# Patient Record
Sex: Female | Born: 1975 | Race: White | Hispanic: No | Marital: Married | Smoking: Never smoker
Health system: Southern US, Community
[De-identification: ages and names within clinical notes are randomized; demographics above are authoritative.]

## PROBLEM LIST (undated history)

## (undated) ENCOUNTER — Inpatient Hospital Stay (HOSPITAL_COMMUNITY): Payer: Self-pay

## (undated) ENCOUNTER — Emergency Department (HOSPITAL_BASED_OUTPATIENT_CLINIC_OR_DEPARTMENT_OTHER): Admission: EM | Payer: Self-pay | Source: Home / Self Care

## (undated) DIAGNOSIS — T7840XA Allergy, unspecified, initial encounter: Secondary | ICD-10-CM

## (undated) DIAGNOSIS — F329 Major depressive disorder, single episode, unspecified: Secondary | ICD-10-CM

## (undated) DIAGNOSIS — Z789 Other specified health status: Secondary | ICD-10-CM

## (undated) DIAGNOSIS — F32A Depression, unspecified: Secondary | ICD-10-CM

## (undated) DIAGNOSIS — F419 Anxiety disorder, unspecified: Secondary | ICD-10-CM

## (undated) HISTORY — PX: LASIK: SHX215

## (undated) HISTORY — DX: Major depressive disorder, single episode, unspecified: F32.9

## (undated) HISTORY — DX: Depression, unspecified: F32.A

## (undated) HISTORY — DX: Anxiety disorder, unspecified: F41.9

## (undated) HISTORY — DX: Allergy, unspecified, initial encounter: T78.40XA

## (undated) HISTORY — PX: MOLE REMOVAL: SHX2046

---

## 2012-04-21 NOTE — L&D Delivery Note (Signed)
Delivery Note At 2:27 AM a healthy female was delivered via Vaginal, Spontaneous Delivery (Presentation: Vtx  ).  APGAR: 9,9 ; weight pending .   Placenta status: Intact, Spontaneous.  Cord:  with the following complications: .  Cord pH: not done  Anesthesia: Epidural  Episiotomy: None Lacerations: 2nd degree perineal Suture Repair: vicryl rapide Est. Blood Loss (mL): 300 Mom to postpartum.  Baby to nursery-stable.  Yanina Knupp,MARIE-LYNE 12/19/2012, 3:01 AM

## 2012-05-08 ENCOUNTER — Encounter (HOSPITAL_COMMUNITY): Payer: Self-pay

## 2012-05-08 ENCOUNTER — Inpatient Hospital Stay (HOSPITAL_COMMUNITY): Payer: BC Managed Care – PPO

## 2012-05-08 ENCOUNTER — Inpatient Hospital Stay (HOSPITAL_COMMUNITY)
Admission: AD | Admit: 2012-05-08 | Discharge: 2012-05-08 | Disposition: A | Discharge status: Home / Self Care | Payer: BC Managed Care – PPO | Source: Ambulatory Visit | Attending: Obstetrics and Gynecology | Admitting: Obstetrics and Gynecology

## 2012-05-08 DIAGNOSIS — O200 Threatened abortion: Secondary | ICD-10-CM | POA: Diagnosis present

## 2012-05-08 NOTE — MAU Note (Signed)
I had spotting Thurs and had blood work at office. Told me my hormone levels were very good and high and they were not concerned. Today about 1700 had more bright blood on tissue when wiped and one small clot. Slight aching right side but not a lot of pain

## 2012-05-08 NOTE — MAU Provider Note (Signed)
  History   CSN: [id number]  Arrival date and time: 05/08/12 2045 Call to provider @ 2118 Provider in to examine patient @ 2145  Chief Complaint  Patient presents with  . Vaginal Bleeding   HPI Spotting last week - quant done at [facility] (>20.000) ABO/ RH - B positive No pain or cramping / some aching on right side since + UPT Seen in office by Dr Ernestina Penna for diagnosis of pregnancy / records pending from [facility] scheduled for ultrasound on 1/22 @ [facility]  No past medical history on file. G2 P 1 with previous term SVD in [facility] / first trimester bleeding from [facility] Sure LMP - EDC by LMP 12-29-2012  No past surgical history on file.  No family history on file.  History  Substance Use Topics  . Smoking status: Never Smoker   . Smokeless tobacco: Not on file  . Alcohol Use: No   Allergies: No Known Allergies  No prescriptions prior to admission   ROS Physical Exam   Blood pressure 140/84, pulse 90, temperature 98.7 F (37.1 C), temperature source Oral, resp. rate 16, height 5\' 7"  (1.702 m), weight 69.4 kg (153 lb), last menstrual period 03/24/2012.  Physical Exam Alert and oriented NAD or discomfort  Abdomen soft and non-tender  External genitalia wnl / no lesions or rashes / small dark red blood on peripad  Spec exam :Vaginal vault with small amount dark red blood Cervix appears closed / no clots or tissue visualized  Bimanual exam:uterus retroverted / no appreciable enlargement / no tenderness Adnexa without enlargement or tenderness  MAU Course  Procedures Transvaginal ultrasound Clinical Data: Bleeding and right pelvic pain. Estimated  gestational age by LMP is 6 weeks 3 days.  OBSTETRIC <14 WK [place] AND TRANSVAGINAL OB US  Technique: Both transabdominal and transvaginal ultrasound  examinations were performed for complete evaluation of the  gestation as well as the maternal uterus, adnexal regions, and  pelvic cul-de-sac. Transvaginal technique was  performed to assess  early pregnancy.  Comparison: None.  Intrauterine gestational sac: A single intrauterine pregnancy is  visualized. There is heterogeneous fluid collection to the left of  the gestational sac and extending inferiorly consistent with a  large subchorionic hemorrhage, measuring about 4.3 x 2.2 x 2.2 cm.   Yolk sac: The yolk sac is visualized.  Embryo: The fetal pole is visualized.  Cardiac Activity: Fetal cardiac activity is observed.  Heart Rate: 122 bpm  CRL: 7.1 mm 6 w 5 d [place] EDC: 12/27/2012   Maternal uterus/adnexae:  The right ovary measures 2.6 x 3.3 x 2.3 cm and contains a corpus  luteal cyst. The left ovary measures 1.4 x 2.8 x 1.3 cm with  normal follicular changes. Small amount of free fluid around the  left ovary.     Assessment and Plan  6 weeks by LMP Threatened Abortion - bleeding first trimester from [facility]  1) discharge home 2) pelvic rest and miscarriage precautions to call 3) keep appointment at [facility] 1/22 for repeat ultrasound  Answered questions about breastfeeding - ok to continue weaning / BF did NOT cause bleeding.  Will not contribute to increase risk for miscarriage. No prevention or treatment for [facility] -only supportive care with decreased activity until bleeding stops.  Marlinda Mike 05/08/2012, 9:58 PM

## 2012-05-08 NOTE — MAU Note (Signed)
Notified Marlinda Mike CNM patient G2P1 [redacted]w[redacted]d had spotting last week, BHCG was checked in MD office and was told high enough no cause for concern, bleeding today heavier, denies pain, CNM will come to MAU to evaluate patient.

## 2012-05-19 LAB — OB RESULTS CONSOLE RPR: RPR: NONREACTIVE

## 2012-05-19 LAB — OB RESULTS CONSOLE ABO/RH: RH Type: POSITIVE

## 2012-05-19 LAB — OB RESULTS CONSOLE HIV ANTIBODY (ROUTINE TESTING): HIV: NONREACTIVE

## 2012-05-19 LAB — OB RESULTS CONSOLE HEPATITIS B SURFACE ANTIGEN: Hepatitis B Surface Ag: NEGATIVE

## 2012-12-18 ENCOUNTER — Inpatient Hospital Stay (HOSPITAL_COMMUNITY)
Admission: AD | Admit: 2012-12-18 | Discharge: 2012-12-20 | DRG: 373 | Disposition: A | Discharge status: Home / Self Care | Payer: BC Managed Care – PPO | Source: Ambulatory Visit | Attending: Obstetrics & Gynecology | Admitting: Obstetrics & Gynecology

## 2012-12-18 ENCOUNTER — Encounter (HOSPITAL_COMMUNITY): Payer: Self-pay

## 2012-12-18 ENCOUNTER — Inpatient Hospital Stay (HOSPITAL_COMMUNITY): Payer: BC Managed Care – PPO | Admitting: Anesthesiology

## 2012-12-18 ENCOUNTER — Encounter (HOSPITAL_COMMUNITY): Payer: Self-pay | Admitting: Anesthesiology

## 2012-12-18 DIAGNOSIS — O09529 Supervision of elderly multigravida, unspecified trimester: Secondary | ICD-10-CM | POA: Diagnosis present

## 2012-12-18 DIAGNOSIS — Z22330 Carrier of Group B streptococcus: Secondary | ICD-10-CM

## 2012-12-18 DIAGNOSIS — O200 Threatened abortion: Secondary | ICD-10-CM

## 2012-12-18 DIAGNOSIS — O99892 Other specified diseases and conditions complicating childbirth: Secondary | ICD-10-CM | POA: Diagnosis present

## 2012-12-18 HISTORY — DX: Other specified health status: Z78.9

## 2012-12-18 LAB — CBC
Hemoglobin: 12.7 g/dL (ref 12.0–15.0)
MCH: 28.7 pg (ref 26.0–34.0)
MCHC: 34.3 g/dL (ref 30.0–36.0)
MCV: 83.7 fL (ref 78.0–100.0)
Platelets: 280 10*3/uL (ref 150–400)
RBC: 4.42 MIL/uL (ref 3.87–5.11)

## 2012-12-18 MED ORDER — PENICILLIN G POTASSIUM 5000000 UNITS IJ SOLR
5.0000 10*6.[IU] | Freq: Once | INTRAMUSCULAR | Status: AC
  Administered 2012-12-18: 5 10*6.[IU] via INTRAVENOUS
  Filled 2012-12-18: qty 5

## 2012-12-18 MED ORDER — EPHEDRINE 5 MG/ML INJ
10.0000 mg | INTRAVENOUS | Status: DC | PRN
  Filled 2012-12-18: qty 4
  Filled 2012-12-18: qty 2

## 2012-12-18 MED ORDER — CITRIC ACID-SODIUM CITRATE 334-500 MG/5ML PO SOLN: 30.0000 mL | ORAL | Status: DC | PRN

## 2012-12-18 MED ORDER — OXYTOCIN BOLUS FROM INFUSION: 500.0000 mL | INTRAVENOUS | Status: DC

## 2012-12-18 MED ORDER — PHENYLEPHRINE 40 MCG/ML (10ML) SYRINGE FOR IV PUSH (FOR BLOOD PRESSURE SUPPORT)
80.0000 ug | PREFILLED_SYRINGE | INTRAVENOUS | Status: DC | PRN
  Filled 2012-12-18: qty 5
  Filled 2012-12-18: qty 2

## 2012-12-18 MED ORDER — LACTATED RINGERS IV SOLN
500.0000 mL | Freq: Once | INTRAVENOUS | Status: AC
  Administered 2012-12-18: 500 mL via INTRAVENOUS

## 2012-12-18 MED ORDER — OXYTOCIN 40 UNITS IN LACTATED RINGERS INFUSION - SIMPLE MED
62.5000 mL/h | INTRAVENOUS | Status: DC
  Administered 2012-12-19: 62.5 mL/h via INTRAVENOUS
  Filled 2012-12-18: qty 1000

## 2012-12-18 MED ORDER — IBUPROFEN 600 MG PO TABS
600.0000 mg | ORAL_TABLET | Freq: Four times a day (QID) | ORAL | Status: DC | PRN
  Administered 2012-12-19: 600 mg via ORAL
  Filled 2012-12-18: qty 1

## 2012-12-18 MED ORDER — FENTANYL 2.5 MCG/ML BUPIVACAINE 1/10 % EPIDURAL INFUSION (WH - ANES)
14.0000 mL/h | INTRAMUSCULAR | Status: DC | PRN
  Administered 2012-12-18: 14 mL/h via EPIDURAL
  Filled 2012-12-18: qty 125

## 2012-12-18 MED ORDER — ONDANSETRON HCL 4 MG/2ML IJ SOLN: 4.0000 mg | Freq: Four times a day (QID) | INTRAMUSCULAR | Status: DC | PRN

## 2012-12-18 MED ORDER — LIDOCAINE HCL (PF) 1 % IJ SOLN
30.0000 mL | INTRAMUSCULAR | Status: DC | PRN
  Administered 2012-12-19: 30 mL via SUBCUTANEOUS
  Filled 2012-12-18 (×2): qty 30

## 2012-12-18 MED ORDER — PHENYLEPHRINE 40 MCG/ML (10ML) SYRINGE FOR IV PUSH (FOR BLOOD PRESSURE SUPPORT)
80.0000 ug | PREFILLED_SYRINGE | INTRAVENOUS | Status: DC | PRN
  Filled 2012-12-18: qty 2

## 2012-12-18 MED ORDER — ACETAMINOPHEN 325 MG PO TABS: 650.0000 mg | ORAL_TABLET | ORAL | Status: DC | PRN

## 2012-12-18 MED ORDER — LACTATED RINGERS IV SOLN: INTRAVENOUS | Status: DC

## 2012-12-18 MED ORDER — DIPHENHYDRAMINE HCL 50 MG/ML IJ SOLN: 12.5000 mg | INTRAMUSCULAR | Status: DC | PRN

## 2012-12-18 MED ORDER — PENICILLIN G POTASSIUM 5000000 UNITS IJ SOLR
2.5000 10*6.[IU] | INTRAMUSCULAR | Status: DC
  Filled 2012-12-18 (×4): qty 2.5

## 2012-12-18 MED ORDER — OXYCODONE-ACETAMINOPHEN 5-325 MG PO TABS: 1.0000 | ORAL_TABLET | ORAL | Status: DC | PRN

## 2012-12-18 MED ORDER — LACTATED RINGERS IV SOLN: 500.0000 mL | INTRAVENOUS | Status: DC | PRN

## 2012-12-18 MED ORDER — EPHEDRINE 5 MG/ML INJ
10.0000 mg | INTRAVENOUS | Status: DC | PRN
  Filled 2012-12-18: qty 2

## 2012-12-18 MED ORDER — FLEET ENEMA 7-19 GM/118ML RE ENEM: 1.0000 | ENEMA | RECTAL | Status: DC | PRN

## 2012-12-18 MED ORDER — LIDOCAINE HCL (PF) 1 % IJ SOLN
INTRAMUSCULAR | Status: DC | PRN
  Administered 2012-12-18 (×2): 5 mL

## 2012-12-18 NOTE — Anesthesia Preprocedure Evaluation (Signed)

## 2012-12-18 NOTE — Plan of Care (Signed)
Problem: Consults Goal: Birthing Suites Patient Information Press F2 to bring up selections list Outcome: Completed/Met Date Met:  12/18/12  Pt 37-[redacted] weeks EGA

## 2012-12-18 NOTE — Anesthesia Procedure Notes (Signed)
Epidural Patient location during procedure: OB Start time: 12/18/2012 11:15 PM  Staffing Anesthesiologist: Angus Seller., Harrell Gave. Performed by: anesthesiologist   Preanesthetic Checklist Completed: patient identified, site marked, surgical consent, pre-op evaluation, timeout performed, IV checked, risks and benefits discussed and monitors and equipment checked  Epidural Patient position: sitting Prep: site prepped and draped and DuraPrep Patient monitoring: continuous pulse ox and blood pressure Approach: midline Injection technique: LOR air and LOR saline  Needle:  Needle type: Tuohy  Needle gauge: 17 G Needle length: 9 cm and 9 Needle insertion depth: 5 cm cm Catheter type: closed end flexible Catheter size: 19 Gauge Catheter at skin depth: 10 cm Test dose: negative  Assessment Events: blood not aspirated, injection not painful, no injection resistance, negative IV test and no paresthesia  Additional Notes Patient identified.  Risk benefits discussed including failed block, incomplete pain control, headache, nerve damage, paralysis, blood pressure changes, nausea, vomiting, reactions to medication both toxic or allergic, and postpartum back pain.  Patient expressed understanding and wished to proceed.  All questions were answered.  Sterile technique used throughout procedure and epidural site dressed with sterile barrier dressing. No paresthesia or other complications noted.The patient did not experience any signs of intravascular injection such as tinnitus or metallic taste in mouth nor signs of intrathecal spread such as rapid motor block. Please see nursing notes for vital signs.

## 2012-12-19 ENCOUNTER — Encounter (HOSPITAL_COMMUNITY): Payer: Self-pay | Admitting: *Deleted

## 2012-12-19 LAB — CBC
MCHC: 33.6 g/dL (ref 30.0–36.0)
Platelets: 251 10*3/uL (ref 150–400)
RDW: 13.7 % (ref 11.5–15.5)

## 2012-12-19 MED ORDER — TETANUS-DIPHTH-ACELL PERTUSSIS 5-2.5-18.5 LF-MCG/0.5 IM SUSP: 0.5000 mL | Freq: Once | INTRAMUSCULAR | Status: DC

## 2012-12-19 MED ORDER — DIBUCAINE 1 % RE OINT: 1.0000 "application " | TOPICAL_OINTMENT | RECTAL | Status: DC | PRN

## 2012-12-19 MED ORDER — ONDANSETRON HCL 4 MG/2ML IJ SOLN: 4.0000 mg | INTRAMUSCULAR | Status: DC | PRN

## 2012-12-19 MED ORDER — BENZOCAINE-MENTHOL 20-0.5 % EX AERO
1.0000 "application " | INHALATION_SPRAY | CUTANEOUS | Status: DC | PRN
  Administered 2012-12-19: 1 via TOPICAL
  Filled 2012-12-19: qty 56

## 2012-12-19 MED ORDER — PRENATAL MULTIVITAMIN CH
1.0000 | ORAL_TABLET | Freq: Every day | ORAL | Status: DC
  Filled 2012-12-19: qty 1

## 2012-12-19 MED ORDER — SENNOSIDES-DOCUSATE SODIUM 8.6-50 MG PO TABS: 2.0000 | ORAL_TABLET | Freq: Every day | ORAL | Status: DC

## 2012-12-19 MED ORDER — OXYTOCIN 40 UNITS IN LACTATED RINGERS INFUSION - SIMPLE MED: 62.5000 mL/h | INTRAVENOUS | Status: DC | PRN

## 2012-12-19 MED ORDER — OXYCODONE-ACETAMINOPHEN 5-325 MG PO TABS
1.0000 | ORAL_TABLET | ORAL | Status: DC | PRN
  Administered 2012-12-19 – 2012-12-20 (×4): 1 via ORAL
  Filled 2012-12-19 (×4): qty 1

## 2012-12-19 MED ORDER — IBUPROFEN 600 MG PO TABS
600.0000 mg | ORAL_TABLET | Freq: Four times a day (QID) | ORAL | Status: DC
  Administered 2012-12-19 – 2012-12-20 (×5): 600 mg via ORAL
  Filled 2012-12-19 (×5): qty 1

## 2012-12-19 MED ORDER — ZOLPIDEM TARTRATE 5 MG PO TABS: 5.0000 mg | ORAL_TABLET | Freq: Every evening | ORAL | Status: DC | PRN

## 2012-12-19 MED ORDER — DIPHENHYDRAMINE HCL 25 MG PO CAPS: 25.0000 mg | ORAL_CAPSULE | Freq: Four times a day (QID) | ORAL | Status: DC | PRN

## 2012-12-19 MED ORDER — WITCH HAZEL-GLYCERIN EX PADS: 1.0000 "application " | MEDICATED_PAD | CUTANEOUS | Status: DC | PRN

## 2012-12-19 MED ORDER — SIMETHICONE 80 MG PO CHEW: 80.0000 mg | CHEWABLE_TABLET | ORAL | Status: DC | PRN

## 2012-12-19 MED ORDER — ONDANSETRON HCL 4 MG PO TABS: 4.0000 mg | ORAL_TABLET | ORAL | Status: DC | PRN

## 2012-12-19 MED ORDER — LANOLIN HYDROUS EX OINT: TOPICAL_OINTMENT | CUTANEOUS | Status: DC | PRN

## 2012-12-19 NOTE — Lactation Note (Signed)
This note was copied from the chart of Beth Anjani Feuerborn. Lactation Consultation Note     Initial consult with this mom and term baby, now 9 hours post partum. Mom was breast feeding in cradle hold when I walked into the room. I reviewed with mom the recommended cross-cradle or football for the first 2-3 weeks, to obtain a deeper latch, and assisted mom with doing this. The baby was not interested in feeding at this time, so skin to skin was advised. Cue based and luster feeding reviewed, as well as lactation services. Mom knows to call for questions/concerns.  Patient Name: Beth Reid [id number] Date: 12/19/2012 Reason for consult: Initial assessment   Maternal Data Formula Feeding for Exclusion: No Has patient been taught Hand Expression?: Yes Does the patient have breastfeeding experience prior to this delivery?: Yes  Feeding Feeding Type: Breast Milk  LATCH Score/Interventions Latch: Repeated attempts needed to sustain latch, nipple held in mouth throughout feeding, stimulation needed to elicit sucking reflex. Intervention(s): Adjust position;Assist with latch  Audible Swallowing: None Intervention(s): Skin to skin;Hand expression (drpos expressed into baby's mouth) Intervention(s): Hand expression  Type of Nipple: Everted at rest and after stimulation  Comfort (Breast/Nipple): Soft / non-tender (nipple stripes noted. frenulum at tip of tongue)     Hold (Positioning): Assistance needed to correctly position infant at breast and maintain latch. Intervention(s): Breastfeeding basics reviewed;Support Pillows;Position options;Skin to skin (cross cradle hold advised, cue based and cluster feeding)  LATCH Score: 6  Lactation Tools Discussed/Used     Consult Status Consult Status: Follow-up Date: 12/20/12 Follow-up type: In-patient    Alfred Levins 12/19/2012, 12:17 PM

## 2012-12-19 NOTE — Clinical Social Work Note (Signed)
CSW spoke with MOB.  MOB reports hx of PPD with first infant, however she feels this was situational due to having a major infection after she returned home from delivering first baby, and needing medical support.  No current concerns and MOB expressed knowing what symptoms to look out for.   Patient was referred for history of depression/anxiety.  * Referral screened out by Clinical Social Worker because none of the following criteria appear to apply: ~ History of anxiety/depression during this pregnancy, or of post-partum depression. ~ Diagnosis of anxiety and/or depression within last 3 years ~ History of depression due to pregnancy loss/loss of child  OR  * Patient's symptoms currently being treated with medication and/or therapy.  Please contact the Clinical Social Worker if needs arise, or by the patient's request.  

## 2012-12-19 NOTE — H&P (Signed)
Subjective:  Beth Reid is a 37 y.o. G2 P1 female with EDC 12/29/12 at 30 and 4/[redacted] weeks gestation who is being admitted for labor management.  Her current obstetrical history is significant for [facility] resolved at 18 wk [place].  Patient reports no complaints.   Fetal Movement: normal.     Objective:   Vital signs in last 24 hours: Temp:  [97 F (36.1 C)-98.2 F (36.8 C)] 97.9 F (36.6 C) (08/31 0030) Pulse Rate:  [30-109] 102 (08/31 0100) Resp:  [16-18] 18 (08/31 0030) BP: (114-141)/(61-97) 120/61 mmHg (08/31 0100) SpO2:  [80 %-100 %] 100 % (08/30 2340) Weight:  [80.377 kg (177 lb 3.2 oz)] 80.377 kg (177 lb 3.2 oz) (08/30 2144)   General:   alert  Skin:   normal  HEENT:  PERRLA  Lungs:   clear to auscultation bilaterally  Heart:   regular rate and rhythm  Breasts:   Deferred  Abdomen:  Gravid  Pelvis:  Exam deferred.  FHT:  140's BPM, good variability, no deceleration.  Uterine Size: size equals dates  Presentations: cephalic  Cervix:    Dilation: 8   Effacement: 100%   Station:  -1   Consistency: soft   Position: middle                              UCs q3 min +++ Lab Review  B, Rh+, GBS positive  AFP:NML  One hour GTT: Normal    Assessment/Plan:  38 and 4/[redacted] weeks gestation. Active phase labor.  F-Well being reassuring.  GBS pos.  Pen G. Obstetrical history significant for resolved SCH at 18 wk [place] .     Risks, benefits, alternatives and possible complications have been discussed in detail with the patient.  Pre-admission, admission, and post admission procedures and expectations were discussed in detail.  All questions answered, all appropriate consents will be signed at the Hospital. Admission is planned for today.  Expectant management.  Epidural PRN.  Genia Del MD  12/19/2012

## 2012-12-19 NOTE — Anesthesia Postprocedure Evaluation (Signed)
  Anesthesia Post-op Note  Patient: Beth Reid  Procedure(s) Performed: * No procedures listed *  Patient Location: PACU and Mother/Baby  Anesthesia Type:Epidural  Level of Consciousness: awake, alert  and oriented  Airway and Oxygen Therapy: Patient Spontanous Breathing  Post-op Pain: none  Post-op Assessment: Post-op Vital signs reviewed, Patient's Cardiovascular Status Stable, No headache, No backache, No residual numbness and No residual motor weakness  Post-op Vital Signs: Reviewed and stable  Complications: No apparent anesthesia complications

## 2012-12-20 MED ORDER — OXYCODONE-ACETAMINOPHEN 5-325 MG PO TABS: 1.0000 | ORAL_TABLET | ORAL | Status: DC | PRN

## 2012-12-20 MED ORDER — IBUPROFEN 600 MG PO TABS: 600.0000 mg | ORAL_TABLET | Freq: Four times a day (QID) | ORAL | Status: DC

## 2012-12-20 NOTE — Progress Notes (Signed)
PPD #1- SVD  Subjective:   Reports feeling good Tolerating po/ No nausea or vomiting Bleeding is light Pain controlled with Motrin and Percocet Up ad lib / ambulatory / voiding without problems Newborn: breastfeeding  / Circumcision: no   Objective:   VS: Temp:  [97.5 F (36.4 C)] 97.5 F (36.4 C) (09/01 0528) Pulse Rate:  [66-69] 69 (09/01 0528) Resp:  [18] 18 (09/01 0528) BP: (95-110)/(58-70) 95/58 mmHg (09/01 0528)  LABS:  Recent Labs  12/18/12 2156 12/19/12 0700  WBC 17.1* 23.4*  HGB 12.7 11.1*  PLT 280 251   Blood type: B/Positive/-- (01/29 0000) Rubella: Immune (01/29 0000)                I&O: Intake/Output     08/31 0701 - 09/01 0700 09/01 0701 - 09/02 0700   Blood     Total Output       Net              Physical Exam: Alert and oriented X3 Lungs: Clear and unlabored Heart: regular rate and rhythm / no mumurs Abdomen: soft, non-tender, non-distended  Fundus: firm, non-tender, U-1 Perineum: Well approximated, no significant erythema, edema, or drainage; healing well. Lochia: small Extremities: no edema, no calf pain or tenderness    Assessment: PPD # 2 G2P2002/ S/P:spontaneous vaginal, 2nd degree laceration Doing well - stable for discharge home   Plan: Discharge home RX's:  Ibuprofen 600mg  po Q 6 hrs prn pain #30 Refill x 0 Percocet 5/325 1 to 2 po Q 4 hrs prn pain #30 No refill Routine pp visit in 6wks [facility] booklet given    Beth Reid, N MSN, CNM 12/20/2012, 11:12 AM

## 2012-12-20 NOTE — Discharge Summary (Signed)
Obstetric Discharge Summary Reason for Admission: onset of labor Prenatal Procedures: none Intrapartum Procedures: spontaneous vaginal delivery and GBS prophylaxis Postpartum Procedures: none Complications-Operative and Postpartum: 2nd degree perineal laceration Hemoglobin  Date Value Range Status  12/19/2012 11.1* 12.0 - 15.0 g/dL Final     HCT  Date Value Range Status  12/19/2012 33.0* 36.0 - 46.0 % Final    Physical Exam:  General: alert and cooperative Lochia: appropriate Uterine Fundus: firm Incision: healing well, no significant drainage, no dehiscence, no significant erythema DVT Evaluation: No evidence of DVT seen on physical exam. Negative Homan's sign.  Discharge Diagnoses: Term Pregnancy-delivered  Discharge Information: Date: 12/20/2012 Activity: pelvic rest Diet: routine Medications: PNV, Ibuprofen and Percocet Condition: stable Instructions: refer to practice specific booklet Discharge to: home Follow-up Information   Follow up with [facility] A., MD. Schedule an appointment as soon as possible for a visit in 6 weeks.   Specialty:  Obstetrics and Gynecology   Contact information:   [address] [place] [address] [phone]       Newborn Data: Live born female on 12/19/12 Birth Weight: 9 lb 5.4 oz (4235 g) APGAR: 8, 9  Home with mother.  Man Bonneau, N 12/20/2012, 11:16 AMlavoie

## 2012-12-20 NOTE — Lactation Note (Signed)
This note was copied from the chart of Beth Eugenie Harewood. Lactation Consultation Note: Follow up visit with mom. She reports that baby has been feeding a lot and is fussy through the night. Mom had baby latched to right breast when I went in. Assisted mom in side lying position on left breast. and mom and baby relaxed and baby off to sleep. Comfort gels given. No questions at present. To follow up with LC at [facility].   Patient Name: Beth Reid [id number] Date: 12/20/2012 Reason for consult: Follow-up assessment   Maternal Data Formula Feeding for Exclusion: No  Feeding Feeding Type: Breast Milk Length of feed: 40 min  LATCH Score/Interventions Latch: Grasps breast easily, tongue down, lips flanged, rhythmical sucking.  Audible Swallowing: A few with stimulation Intervention(s): Skin to skin Intervention(s): Skin to skin  Type of Nipple: Everted at rest and after stimulation  Comfort (Breast/Nipple): Filling, red/small blisters or bruises, mild/mod discomfort  Problem noted: Mild/Moderate discomfort  Hold (Positioning): Assistance needed to correctly position infant at breast and maintain latch. Intervention(s): Breastfeeding basics reviewed;Support Pillows;Position options  LATCH Score: 7  Lactation Tools Discussed/Used     Consult Status Consult Status: Complete    Pamelia Hoit 12/20/2012, 10:01 AM

## 2014-02-20 ENCOUNTER — Encounter (HOSPITAL_COMMUNITY): Payer: Self-pay | Admitting: *Deleted

## 2014-12-20 IMAGING — US US OB TRANSVAGINAL
1 series · 13 of 28 positions shown · non-contrast
Comparison: None.

CLINICAL DATA: Bleeding and right pelvic pain.  Estimated
gestational age by LMP is 6 weeks 3 days.

OBSTETRIC <14 WK US AND TRANSVAGINAL OB US
TECHNIQUE: Both transabdominal and transvaginal ultrasound
examinations were performed for complete evaluation of the
gestation as well as the maternal uterus, adnexal regions, and
pelvic cul-de-sac.  Transvaginal technique was performed to assess
early pregnancy.

[Series 1: us ob transvaginal · 57 acquisitions, 13 frames shown]
[im 3/57]
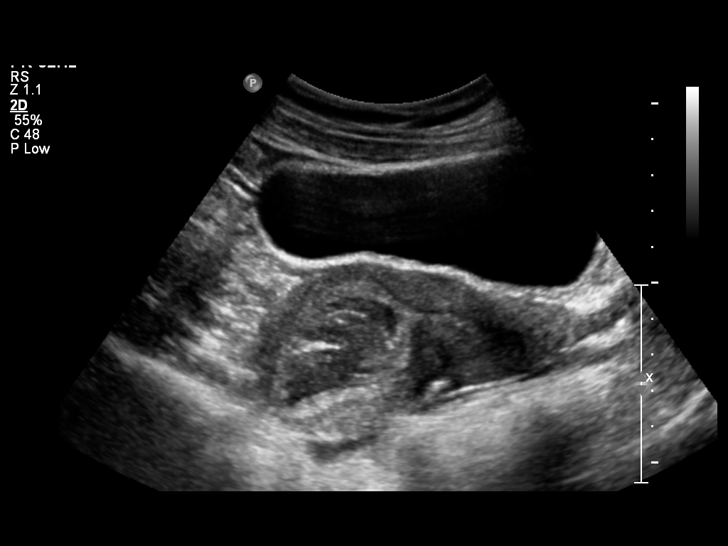
[im 7/57]
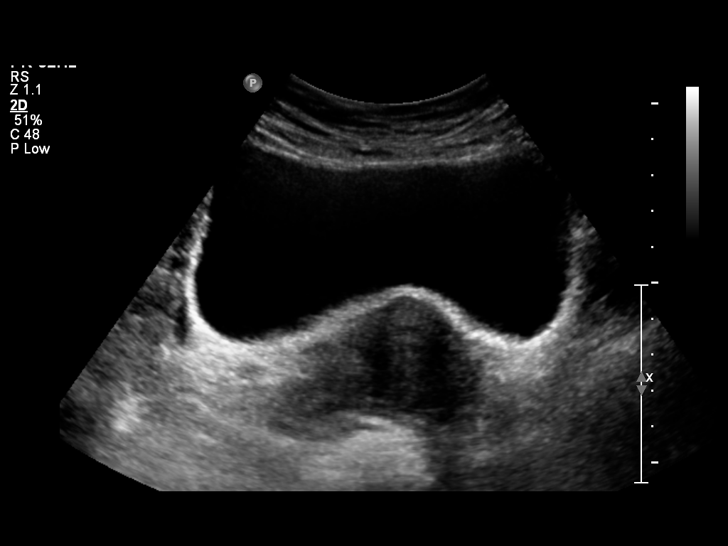
[im 11/57]
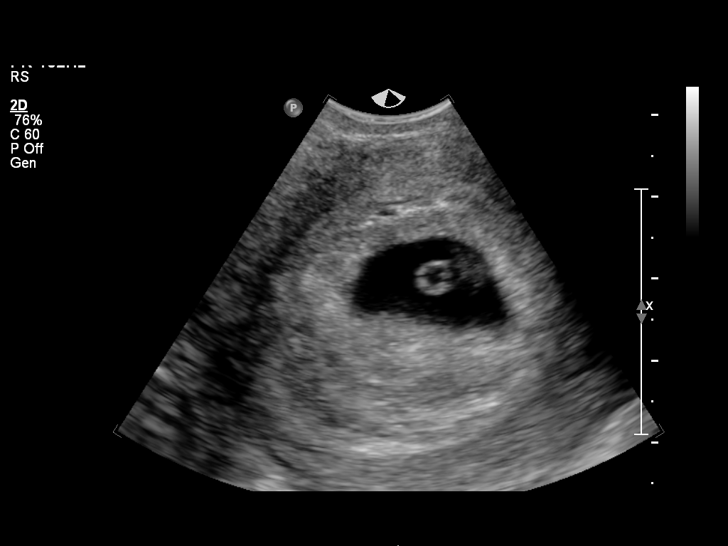
[im 15/57]
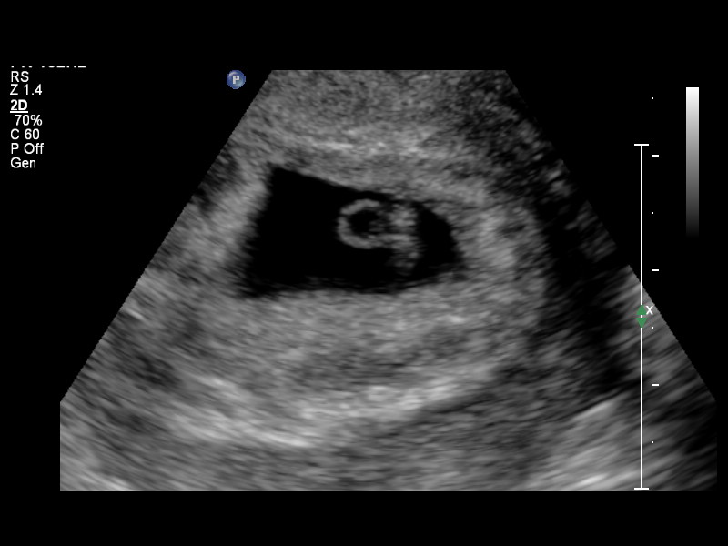
[im 19/57]
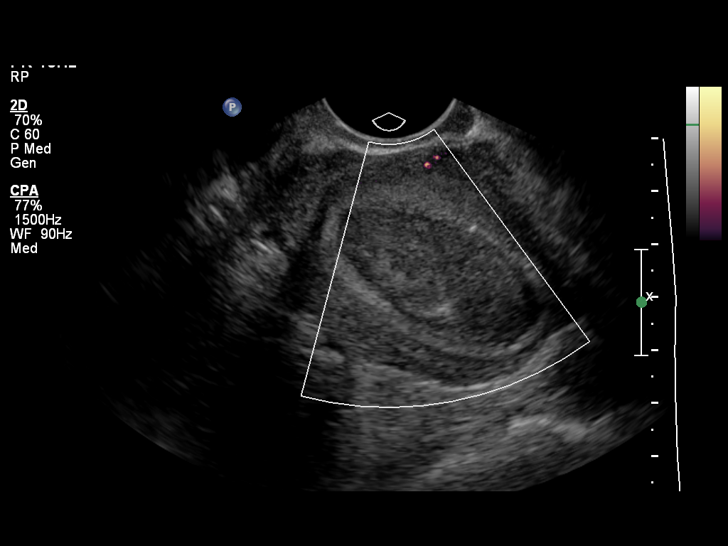
[im 23/57]
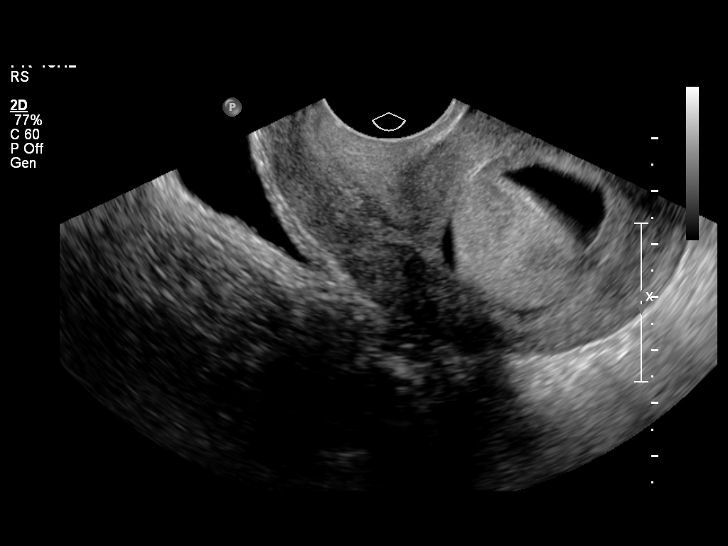
[im 30/57]
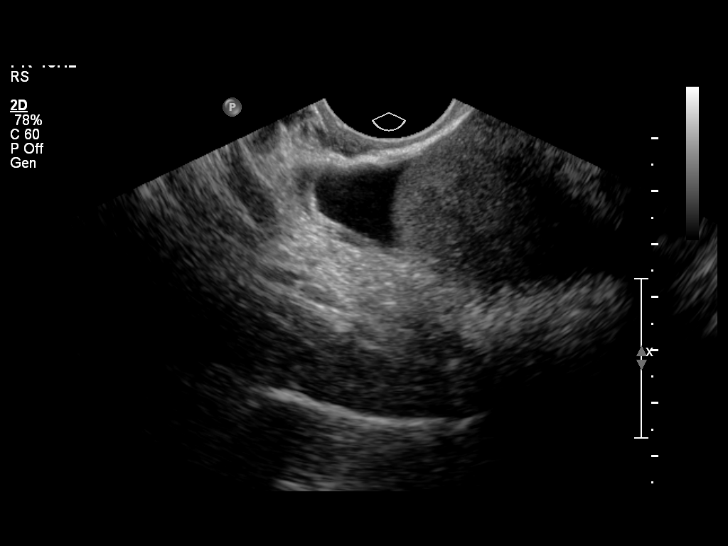
[im 34/57]
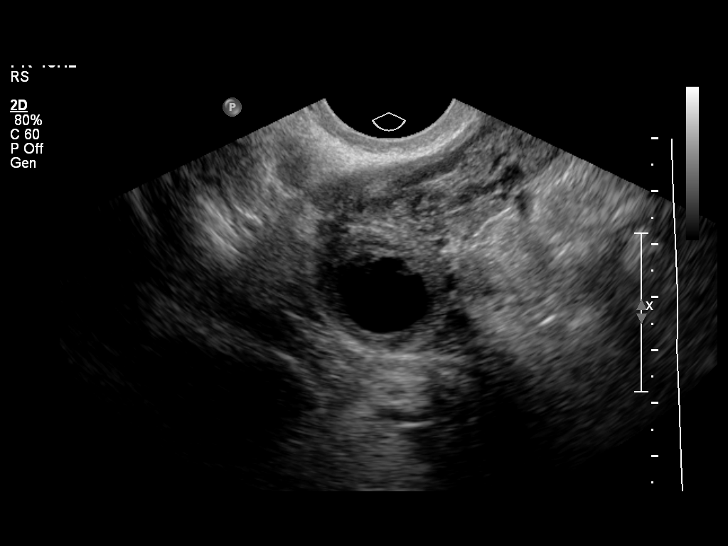
[im 38/57]
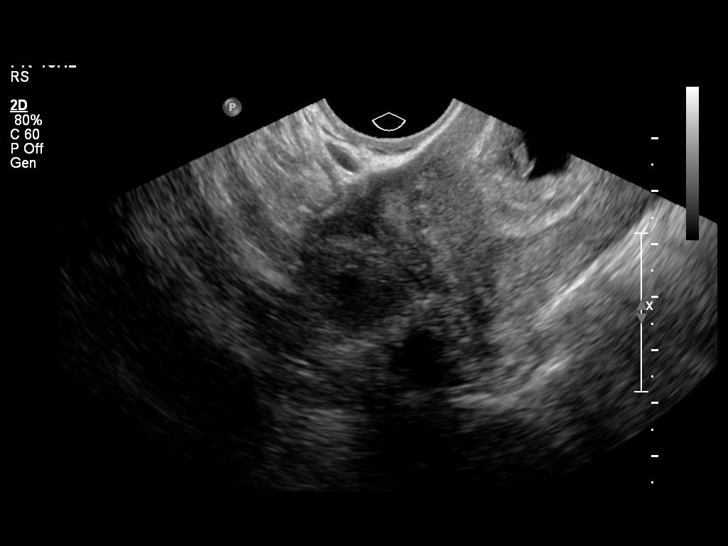
[im 42/57]
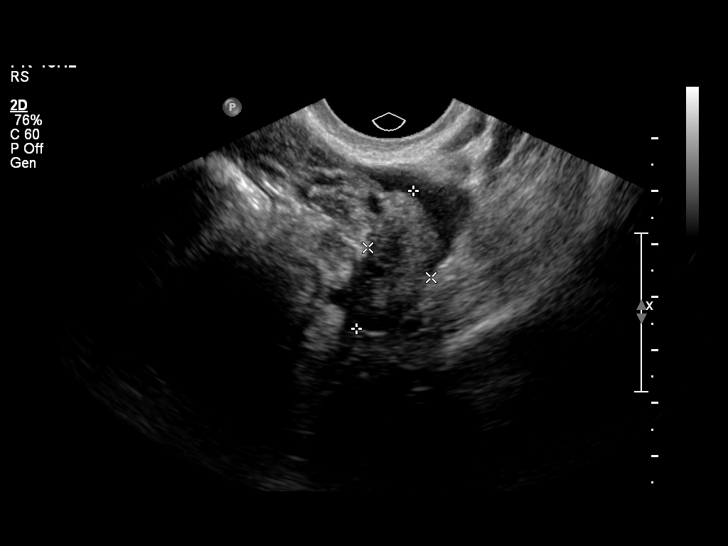
[im 46/57]
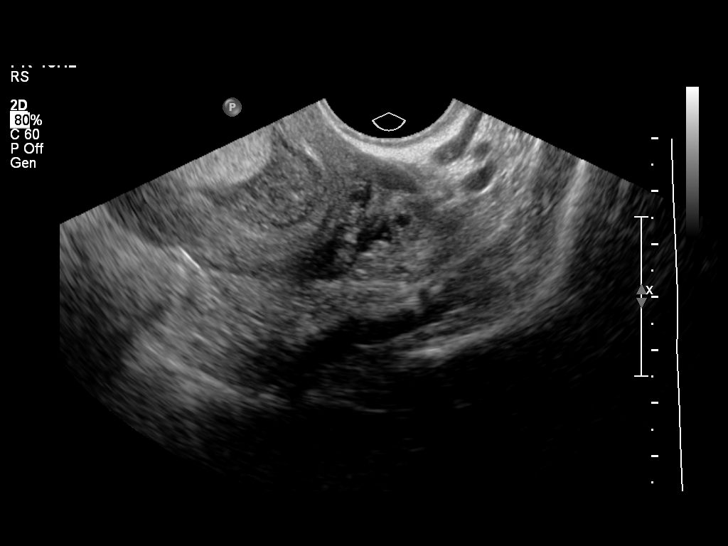
[im 50/57]
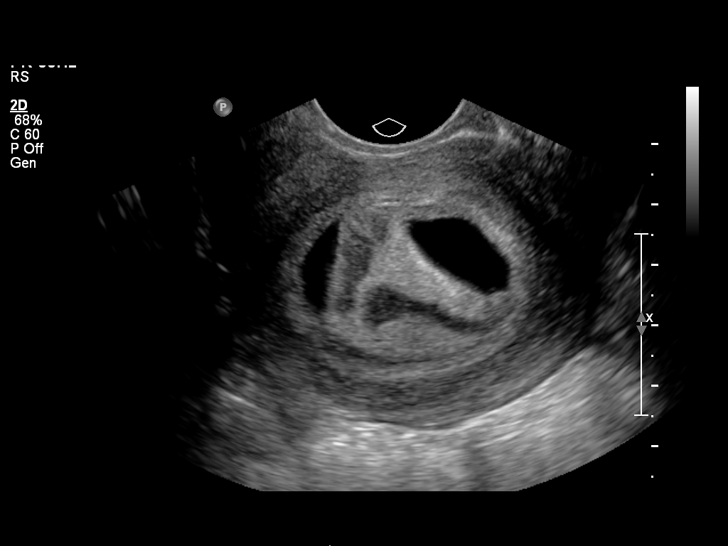
[im 54/57]
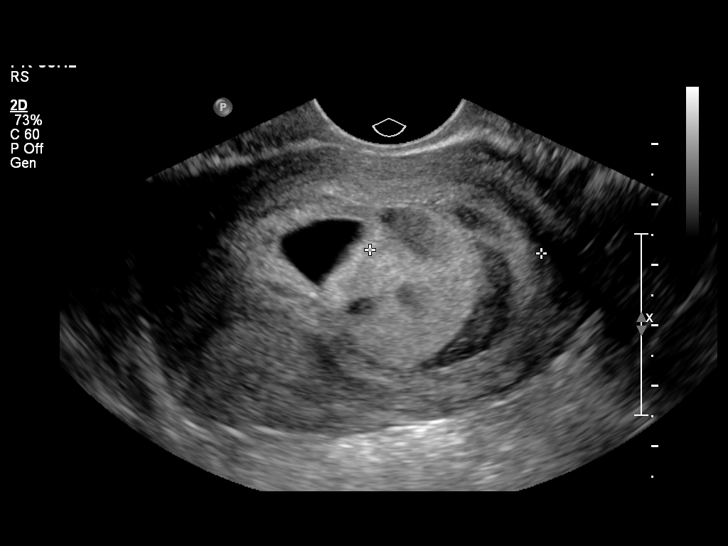

[13 of 28 positions shown; findings below may reference images not displayed]

Intrauterine gestational sac:  A single intrauterine pregnancy is
visualized.  There is heterogeneous fluid collection to the left of
the gestational sac and extending inferiorly consistent with a
large subchorionic hemorrhage, measuring about 4.3 x 2.2 x 2.2 cm..
Yolk sac: The yolk sac is visualized.
Embryo: The fetal pole is visualized.
Cardiac Activity: Fetal cardiac activity is observed.
Heart Rate: 122 bpm

CRL: 7.1  mm  6 w  5 d        US EDC: 12/27/2012

Maternal uterus/adnexae:
The right ovary measures 2.6 x 3.3 x 2.3 cm and contains a corpus
luteal cyst.  The left ovary measures 1.4 x 2.8 x 1.3 cm with
normal follicular changes.  Small amount of free fluid around the
left ovary.
IMPRESSION: Single intrauterine pregnancy.  Estimated gestational age by crown-
rump length is 6 weeks 5 days.  Large subchorionic hemorrhage.

## 2015-08-02 DIAGNOSIS — F411 Generalized anxiety disorder: Secondary | ICD-10-CM | POA: Diagnosis not present

## 2015-08-17 DIAGNOSIS — F411 Generalized anxiety disorder: Secondary | ICD-10-CM | POA: Diagnosis not present

## 2015-08-30 DIAGNOSIS — F411 Generalized anxiety disorder: Secondary | ICD-10-CM | POA: Diagnosis not present

## 2015-09-04 DIAGNOSIS — F411 Generalized anxiety disorder: Secondary | ICD-10-CM | POA: Diagnosis not present

## 2015-09-20 DIAGNOSIS — F411 Generalized anxiety disorder: Secondary | ICD-10-CM | POA: Diagnosis not present

## 2015-09-27 DIAGNOSIS — F411 Generalized anxiety disorder: Secondary | ICD-10-CM | POA: Diagnosis not present

## 2015-10-11 DIAGNOSIS — F411 Generalized anxiety disorder: Secondary | ICD-10-CM | POA: Diagnosis not present

## 2015-10-18 DIAGNOSIS — F411 Generalized anxiety disorder: Secondary | ICD-10-CM | POA: Diagnosis not present

## 2015-10-24 DIAGNOSIS — F411 Generalized anxiety disorder: Secondary | ICD-10-CM | POA: Diagnosis not present

## 2015-10-25 DIAGNOSIS — F411 Generalized anxiety disorder: Secondary | ICD-10-CM | POA: Diagnosis not present

## 2015-11-01 DIAGNOSIS — F411 Generalized anxiety disorder: Secondary | ICD-10-CM | POA: Diagnosis not present

## 2015-11-09 DIAGNOSIS — F411 Generalized anxiety disorder: Secondary | ICD-10-CM | POA: Diagnosis not present

## 2015-11-15 DIAGNOSIS — F411 Generalized anxiety disorder: Secondary | ICD-10-CM | POA: Diagnosis not present

## 2015-11-19 DIAGNOSIS — N920 Excessive and frequent menstruation with regular cycle: Secondary | ICD-10-CM | POA: Diagnosis not present

## 2015-11-19 DIAGNOSIS — N921 Excessive and frequent menstruation with irregular cycle: Secondary | ICD-10-CM | POA: Diagnosis not present

## 2015-11-22 DIAGNOSIS — F411 Generalized anxiety disorder: Secondary | ICD-10-CM | POA: Diagnosis not present

## 2015-11-23 DIAGNOSIS — E039 Hypothyroidism, unspecified: Secondary | ICD-10-CM | POA: Diagnosis not present

## 2015-11-23 DIAGNOSIS — N943 Premenstrual tension syndrome: Secondary | ICD-10-CM | POA: Diagnosis not present

## 2015-11-23 DIAGNOSIS — N921 Excessive and frequent menstruation with irregular cycle: Secondary | ICD-10-CM | POA: Diagnosis not present

## 2015-11-26 DIAGNOSIS — R112 Nausea with vomiting, unspecified: Secondary | ICD-10-CM | POA: Diagnosis not present

## 2015-11-26 DIAGNOSIS — F418 Other specified anxiety disorders: Secondary | ICD-10-CM | POA: Diagnosis not present

## 2015-11-28 DIAGNOSIS — F419 Anxiety disorder, unspecified: Secondary | ICD-10-CM | POA: Diagnosis not present

## 2015-11-28 DIAGNOSIS — R110 Nausea: Secondary | ICD-10-CM | POA: Diagnosis not present

## 2015-11-28 DIAGNOSIS — R197 Diarrhea, unspecified: Secondary | ICD-10-CM | POA: Diagnosis not present

## 2015-11-28 DIAGNOSIS — Z5321 Procedure and treatment not carried out due to patient leaving prior to being seen by health care provider: Secondary | ICD-10-CM | POA: Diagnosis not present

## 2015-11-28 DIAGNOSIS — R109 Unspecified abdominal pain: Secondary | ICD-10-CM | POA: Diagnosis not present

## 2015-11-28 DIAGNOSIS — R451 Restlessness and agitation: Secondary | ICD-10-CM | POA: Diagnosis not present

## 2015-11-29 DIAGNOSIS — F459 Somatoform disorder, unspecified: Secondary | ICD-10-CM | POA: Diagnosis not present

## 2015-11-29 DIAGNOSIS — F410 Panic disorder [episodic paroxysmal anxiety] without agoraphobia: Secondary | ICD-10-CM | POA: Diagnosis not present

## 2015-11-29 DIAGNOSIS — F4322 Adjustment disorder with anxiety: Secondary | ICD-10-CM | POA: Diagnosis not present

## 2015-11-30 DIAGNOSIS — F419 Anxiety disorder, unspecified: Secondary | ICD-10-CM | POA: Diagnosis not present

## 2015-11-30 DIAGNOSIS — R0602 Shortness of breath: Secondary | ICD-10-CM | POA: Diagnosis not present

## 2015-11-30 DIAGNOSIS — F410 Panic disorder [episodic paroxysmal anxiety] without agoraphobia: Secondary | ICD-10-CM | POA: Diagnosis not present

## 2015-11-30 DIAGNOSIS — R451 Restlessness and agitation: Secondary | ICD-10-CM | POA: Diagnosis not present

## 2015-12-01 DIAGNOSIS — F419 Anxiety disorder, unspecified: Secondary | ICD-10-CM | POA: Diagnosis not present

## 2015-12-03 DIAGNOSIS — R946 Abnormal results of thyroid function studies: Secondary | ICD-10-CM | POA: Diagnosis not present

## 2015-12-03 DIAGNOSIS — F419 Anxiety disorder, unspecified: Secondary | ICD-10-CM | POA: Diagnosis not present

## 2015-12-03 DIAGNOSIS — F410 Panic disorder [episodic paroxysmal anxiety] without agoraphobia: Secondary | ICD-10-CM | POA: Diagnosis not present

## 2015-12-04 DIAGNOSIS — F410 Panic disorder [episodic paroxysmal anxiety] without agoraphobia: Secondary | ICD-10-CM | POA: Insufficient documentation

## 2015-12-06 DIAGNOSIS — F411 Generalized anxiety disorder: Secondary | ICD-10-CM | POA: Diagnosis not present

## 2015-12-13 DIAGNOSIS — F410 Panic disorder [episodic paroxysmal anxiety] without agoraphobia: Secondary | ICD-10-CM | POA: Diagnosis not present

## 2015-12-13 DIAGNOSIS — F411 Generalized anxiety disorder: Secondary | ICD-10-CM | POA: Diagnosis not present

## 2015-12-17 DIAGNOSIS — F410 Panic disorder [episodic paroxysmal anxiety] without agoraphobia: Secondary | ICD-10-CM | POA: Diagnosis not present

## 2015-12-21 DIAGNOSIS — E02 Subclinical iodine-deficiency hypothyroidism: Secondary | ICD-10-CM | POA: Diagnosis not present

## 2015-12-22 DIAGNOSIS — F419 Anxiety disorder, unspecified: Secondary | ICD-10-CM | POA: Diagnosis not present

## 2015-12-22 DIAGNOSIS — R197 Diarrhea, unspecified: Secondary | ICD-10-CM | POA: Diagnosis not present

## 2015-12-22 DIAGNOSIS — R110 Nausea: Secondary | ICD-10-CM | POA: Diagnosis not present

## 2015-12-22 DIAGNOSIS — F329 Major depressive disorder, single episode, unspecified: Secondary | ICD-10-CM | POA: Diagnosis not present

## 2015-12-22 DIAGNOSIS — M549 Dorsalgia, unspecified: Secondary | ICD-10-CM | POA: Diagnosis not present

## 2015-12-25 DIAGNOSIS — F410 Panic disorder [episodic paroxysmal anxiety] without agoraphobia: Secondary | ICD-10-CM | POA: Diagnosis not present

## 2015-12-27 DIAGNOSIS — F419 Anxiety disorder, unspecified: Secondary | ICD-10-CM | POA: Diagnosis not present

## 2015-12-27 DIAGNOSIS — F4323 Adjustment disorder with mixed anxiety and depressed mood: Secondary | ICD-10-CM | POA: Diagnosis not present

## 2016-01-04 DIAGNOSIS — F411 Generalized anxiety disorder: Secondary | ICD-10-CM | POA: Diagnosis not present

## 2016-01-04 DIAGNOSIS — F419 Anxiety disorder, unspecified: Secondary | ICD-10-CM | POA: Diagnosis not present

## 2016-01-08 DIAGNOSIS — F411 Generalized anxiety disorder: Secondary | ICD-10-CM | POA: Diagnosis not present

## 2016-01-09 DIAGNOSIS — R109 Unspecified abdominal pain: Secondary | ICD-10-CM | POA: Diagnosis not present

## 2016-01-10 DIAGNOSIS — F411 Generalized anxiety disorder: Secondary | ICD-10-CM | POA: Diagnosis not present

## 2016-01-10 DIAGNOSIS — F419 Anxiety disorder, unspecified: Secondary | ICD-10-CM | POA: Diagnosis not present

## 2016-01-15 DIAGNOSIS — F411 Generalized anxiety disorder: Secondary | ICD-10-CM | POA: Diagnosis not present

## 2016-01-17 DIAGNOSIS — F419 Anxiety disorder, unspecified: Secondary | ICD-10-CM | POA: Diagnosis not present

## 2016-01-17 DIAGNOSIS — F411 Generalized anxiety disorder: Secondary | ICD-10-CM | POA: Diagnosis not present

## 2016-01-22 DIAGNOSIS — F411 Generalized anxiety disorder: Secondary | ICD-10-CM | POA: Diagnosis not present

## 2016-01-24 DIAGNOSIS — F419 Anxiety disorder, unspecified: Secondary | ICD-10-CM | POA: Diagnosis not present

## 2016-01-29 DIAGNOSIS — F411 Generalized anxiety disorder: Secondary | ICD-10-CM | POA: Diagnosis not present

## 2016-01-31 DIAGNOSIS — F411 Generalized anxiety disorder: Secondary | ICD-10-CM | POA: Diagnosis not present

## 2016-01-31 DIAGNOSIS — E02 Subclinical iodine-deficiency hypothyroidism: Secondary | ICD-10-CM | POA: Diagnosis not present

## 2016-01-31 DIAGNOSIS — F419 Anxiety disorder, unspecified: Secondary | ICD-10-CM | POA: Diagnosis not present

## 2016-02-07 DIAGNOSIS — F411 Generalized anxiety disorder: Secondary | ICD-10-CM | POA: Diagnosis not present

## 2016-02-08 DIAGNOSIS — F419 Anxiety disorder, unspecified: Secondary | ICD-10-CM | POA: Diagnosis not present

## 2016-02-15 DIAGNOSIS — F4321 Adjustment disorder with depressed mood: Secondary | ICD-10-CM | POA: Diagnosis not present

## 2016-02-15 DIAGNOSIS — F419 Anxiety disorder, unspecified: Secondary | ICD-10-CM | POA: Diagnosis not present

## 2016-02-21 DIAGNOSIS — F411 Generalized anxiety disorder: Secondary | ICD-10-CM | POA: Diagnosis not present

## 2016-02-22 DIAGNOSIS — F419 Anxiety disorder, unspecified: Secondary | ICD-10-CM | POA: Diagnosis not present

## 2016-02-27 DIAGNOSIS — F419 Anxiety disorder, unspecified: Secondary | ICD-10-CM | POA: Diagnosis not present

## 2016-02-27 DIAGNOSIS — F411 Generalized anxiety disorder: Secondary | ICD-10-CM | POA: Diagnosis not present

## 2016-03-04 DIAGNOSIS — F419 Anxiety disorder, unspecified: Secondary | ICD-10-CM | POA: Diagnosis not present

## 2016-03-10 DIAGNOSIS — F411 Generalized anxiety disorder: Secondary | ICD-10-CM | POA: Diagnosis not present

## 2016-03-11 DIAGNOSIS — F419 Anxiety disorder, unspecified: Secondary | ICD-10-CM | POA: Diagnosis not present

## 2016-03-12 DIAGNOSIS — F411 Generalized anxiety disorder: Secondary | ICD-10-CM | POA: Diagnosis not present

## 2016-03-18 DIAGNOSIS — F419 Anxiety disorder, unspecified: Secondary | ICD-10-CM | POA: Diagnosis not present

## 2016-03-20 DIAGNOSIS — F411 Generalized anxiety disorder: Secondary | ICD-10-CM | POA: Diagnosis not present

## 2016-03-25 DIAGNOSIS — F419 Anxiety disorder, unspecified: Secondary | ICD-10-CM | POA: Diagnosis not present

## 2016-03-27 DIAGNOSIS — F411 Generalized anxiety disorder: Secondary | ICD-10-CM | POA: Diagnosis not present

## 2016-04-02 DIAGNOSIS — F419 Anxiety disorder, unspecified: Secondary | ICD-10-CM | POA: Diagnosis not present

## 2016-04-03 DIAGNOSIS — F411 Generalized anxiety disorder: Secondary | ICD-10-CM | POA: Diagnosis not present

## 2016-04-10 DIAGNOSIS — F411 Generalized anxiety disorder: Secondary | ICD-10-CM | POA: Diagnosis not present

## 2016-04-16 DIAGNOSIS — F419 Anxiety disorder, unspecified: Secondary | ICD-10-CM | POA: Diagnosis not present

## 2016-04-24 DIAGNOSIS — F411 Generalized anxiety disorder: Secondary | ICD-10-CM | POA: Diagnosis not present

## 2016-05-06 DIAGNOSIS — F419 Anxiety disorder, unspecified: Secondary | ICD-10-CM | POA: Diagnosis not present

## 2016-05-15 DIAGNOSIS — F411 Generalized anxiety disorder: Secondary | ICD-10-CM | POA: Diagnosis not present

## 2016-05-30 DIAGNOSIS — F411 Generalized anxiety disorder: Secondary | ICD-10-CM | POA: Diagnosis not present

## 2016-06-03 ENCOUNTER — Telehealth: Payer: Self-pay | Admitting: Family Medicine

## 2016-06-03 DIAGNOSIS — F419 Anxiety disorder, unspecified: Secondary | ICD-10-CM | POA: Diagnosis not present

## 2016-06-03 NOTE — Telephone Encounter (Signed)
-  Patient asking to establish care -Beth KaufmannJessica Reid -D.O.B. [birth year] -She heard about us by one of friends who used to go to [place]  Please call patient back to advise on this request. Thank you

## 2016-06-03 NOTE — Telephone Encounter (Signed)
Ok to establish 

## 2016-06-03 NOTE — Telephone Encounter (Signed)
Pt was already scheduled an hour ago by [place].

## 2016-06-05 DIAGNOSIS — L700 Acne vulgaris: Secondary | ICD-10-CM | POA: Diagnosis not present

## 2016-06-05 DIAGNOSIS — L905 Scar conditions and fibrosis of skin: Secondary | ICD-10-CM | POA: Diagnosis not present

## 2016-06-05 DIAGNOSIS — L730 Acne keloid: Secondary | ICD-10-CM | POA: Diagnosis not present

## 2016-06-12 DIAGNOSIS — F411 Generalized anxiety disorder: Secondary | ICD-10-CM | POA: Diagnosis not present

## 2016-06-24 DIAGNOSIS — M533 Sacrococcygeal disorders, not elsewhere classified: Secondary | ICD-10-CM | POA: Diagnosis not present

## 2016-07-01 DIAGNOSIS — Z01419 Encounter for gynecological examination (general) (routine) without abnormal findings: Secondary | ICD-10-CM | POA: Diagnosis not present

## 2016-07-01 DIAGNOSIS — Z6826 Body mass index (BMI) 26.0-26.9, adult: Secondary | ICD-10-CM | POA: Diagnosis not present

## 2016-07-01 DIAGNOSIS — Z1231 Encounter for screening mammogram for malignant neoplasm of breast: Secondary | ICD-10-CM | POA: Diagnosis not present

## 2016-07-10 DIAGNOSIS — F411 Generalized anxiety disorder: Secondary | ICD-10-CM | POA: Diagnosis not present

## 2016-07-26 LAB — HM PAP SMEAR

## 2016-07-26 LAB — HM MAMMOGRAPHY: HM Mammogram: NORMAL (ref 0–4)

## 2016-07-31 DIAGNOSIS — F411 Generalized anxiety disorder: Secondary | ICD-10-CM | POA: Diagnosis not present

## 2016-08-01 DIAGNOSIS — F419 Anxiety disorder, unspecified: Secondary | ICD-10-CM | POA: Diagnosis not present

## 2016-09-02 DIAGNOSIS — F419 Anxiety disorder, unspecified: Secondary | ICD-10-CM | POA: Diagnosis not present

## 2016-09-03 DIAGNOSIS — F411 Generalized anxiety disorder: Secondary | ICD-10-CM | POA: Diagnosis not present

## 2016-09-18 DIAGNOSIS — F411 Generalized anxiety disorder: Secondary | ICD-10-CM | POA: Diagnosis not present

## 2016-09-25 ENCOUNTER — Ambulatory Visit (INDEPENDENT_AMBULATORY_CARE_PROVIDER_SITE_OTHER): Payer: BLUE CROSS/BLUE SHIELD | Admitting: Family Medicine

## 2016-09-25 ENCOUNTER — Encounter: Payer: Self-pay | Admitting: Family Medicine

## 2016-09-25 DIAGNOSIS — E669 Obesity, unspecified: Secondary | ICD-10-CM | POA: Insufficient documentation

## 2016-09-25 DIAGNOSIS — F419 Anxiety disorder, unspecified: Secondary | ICD-10-CM

## 2016-09-25 DIAGNOSIS — R5383 Other fatigue: Secondary | ICD-10-CM

## 2016-09-25 DIAGNOSIS — E663 Overweight: Secondary | ICD-10-CM

## 2016-09-25 LAB — LIPID PANEL
Cholesterol: 202 mg/dL — ABNORMAL HIGH (ref 0–200)
HDL: 51 mg/dL (ref >39.00)
NONHDL: 151.34
TRIGLYCERIDES: 215 mg/dL — AB (ref 0.0–149.0)
Total CHOL/HDL Ratio: 4
VLDL: 43 mg/dL — ABNORMAL HIGH (ref 0.0–40.0)

## 2016-09-25 LAB — CBC WITH DIFFERENTIAL/PLATELET
BASOS ABS: 0.1 10*3/uL (ref 0.0–0.1)
Basophils Relative: 1.4 % (ref 0.0–3.0)
EOS ABS: 0.1 10*3/uL (ref 0.0–0.7)
Eosinophils Relative: 0.8 % (ref 0.0–5.0)
HEMATOCRIT: 40.8 % (ref 36.0–46.0)
Hemoglobin: 14.1 g/dL (ref 12.0–15.0)
LYMPHS PCT: 21.5 % (ref 12.0–46.0)
Lymphs Abs: 2.3 10*3/uL (ref 0.7–4.0)
MCHC: 34.6 g/dL (ref 30.0–36.0)
MCV: 85 fl (ref 78.0–100.0)
MONOS PCT: 6.4 % (ref 3.0–12.0)
Monocytes Absolute: 0.7 10*3/uL (ref 0.1–1.0)
NEUTROS ABS: 7.5 10*3/uL (ref 1.4–7.7)
Neutrophils Relative %: 69.9 % (ref 43.0–77.0)
Platelets: 277 10*3/uL (ref 150.0–400.0)
RBC: 4.8 Mil/uL (ref 3.87–5.11)
RDW: 13.4 % (ref 11.5–15.5)
WBC: 10.8 10*3/uL — AB (ref 4.0–10.5)

## 2016-09-25 LAB — BASIC METABOLIC PANEL
BUN: 11 mg/dL (ref 6–23)
CO2: 30 meq/L (ref 19–32)
Calcium: 10.1 mg/dL (ref 8.4–10.5)
Chloride: 101 mEq/L (ref 96–112)
Creatinine, Ser: 0.91 mg/dL (ref 0.40–1.20)
GFR: 72.61 mL/min (ref >60.00)
GLUCOSE: 92 mg/dL (ref 70–99)
Potassium: 4.1 mEq/L (ref 3.5–5.1)
SODIUM: 138 meq/L (ref 135–145)

## 2016-09-25 LAB — HEPATIC FUNCTION PANEL
ALT: 25 U/L (ref 0–35)
AST: 23 U/L (ref 0–37)
Albumin: 4.6 g/dL (ref 3.5–5.2)
Alkaline Phosphatase: 78 U/L (ref 39–117)
BILIRUBIN DIRECT: 0.1 mg/dL (ref 0.0–0.3)
BILIRUBIN TOTAL: 0.4 mg/dL (ref 0.2–1.2)
TOTAL PROTEIN: 7.5 g/dL (ref 6.0–8.3)

## 2016-09-25 LAB — B12 AND FOLATE PANEL
FOLATE: 18.6 ng/mL (ref >5.9)
Vitamin B-12: 410 pg/mL (ref 211–911)

## 2016-09-25 LAB — LDL CHOLESTEROL, DIRECT: Direct LDL: 126 mg/dL

## 2016-09-25 LAB — TSH: TSH: 3.36 u[IU]/mL (ref 0.35–4.50)

## 2016-09-25 LAB — VITAMIN D 25 HYDROXY (VIT D DEFICIENCY, FRACTURES): VITD: 29.92 ng/mL — ABNORMAL LOW (ref 30.00–100.00)

## 2016-09-25 NOTE — Progress Notes (Signed)
   Subjective:    Patient ID: Beth Reid, female    DOB: April 11, 1976, 41 y.o.   MRN: [id number]  HPI New to establish.  Beth Reid  Previous PCP- Beth Reid  Anxiety- pt reports sxs worsen w/ hormonal changes.  Pt reports she does not do well with OCPs- 'had massive panic attacks'.  Seeing a Veterinary surgeoncounselor.  Pt reports she had a 'severe reaction to Zoloft.  My skin was burning off'.  Was 'violently ill' on Lexapro.  Now seeing [facility]- Beth Reid.  Now on Buspar twice daily and Paxil 20mg  daily.  Pt is still seeing counselor monthly.  Husband and family are supportive  Overweight- pt's BMI is 28.66.  No regular exercise.  Not following a particular diet. + fatigue.  No CP, SOB- except when anxious.   Review of Systems For ROS see HPI     Objective:   Physical Exam  Constitutional: She is oriented to person, place, and time. She appears well-developed and well-nourished. No distress.  overweight  HENT:  Head: Normocephalic and atraumatic.  Eyes: Conjunctivae and EOM are normal. Pupils are equal, round, and reactive to light.  Neck: Normal range of motion. Neck supple. No thyromegaly present.  Cardiovascular: Normal rate, regular rhythm, normal heart sounds and intact distal pulses.   No murmur heard. Pulmonary/Chest: Effort normal and breath sounds normal. No respiratory distress.  Abdominal: Soft. She exhibits no distension. There is no tenderness.  Musculoskeletal: She exhibits no edema.  Lymphadenopathy:    She has no cervical adenopathy.  Neurological: She is alert and oriented to person, place, and time. She has normal reflexes.  (-) phalen's bilaterally  Skin: Skin is warm and dry.  Psychiatric: She has a normal mood and affect. Her behavior is normal.  Vitals reviewed.         Assessment & Plan:

## 2016-09-25 NOTE — Progress Notes (Signed)
Pre visit review using our clinic review tool, if applicable. No additional management support is needed unless otherwise documented below in the visit note. 

## 2016-09-25 NOTE — Patient Instructions (Addendum)
Schedule your complete physical in 6 months We'll notify you of your lab results and make any changes if needed Try and get regular exercise- it's a good stress relief and very healthy If the numbness continues or worsens, please call Call with any questions or concerns Welcome!  We're glad to have you!

## 2016-09-26 ENCOUNTER — Encounter: Payer: Self-pay | Admitting: General Practice

## 2016-09-26 NOTE — Assessment & Plan Note (Signed)
New.  Suspect this is due to her ongoing and severe anxiety.  Check labs to r/o metabolic cause for fatigue.  Will continue to follow.

## 2016-09-26 NOTE — Assessment & Plan Note (Signed)
New to provider, ongoing for pt.  Severe.  Seeing psych and counselor.  Taking medication as directed.  Encouraged ongoing stress management.  Will follow.

## 2016-09-26 NOTE — Assessment & Plan Note (Signed)
Ongoing issue for pt.  Not exercising or following particular diet.  Check labs to risk stratify.  Stressed need for healthy diet and regular exercise.  Will follow.

## 2016-10-16 DIAGNOSIS — F411 Generalized anxiety disorder: Secondary | ICD-10-CM | POA: Diagnosis not present

## 2016-11-14 DIAGNOSIS — F411 Generalized anxiety disorder: Secondary | ICD-10-CM | POA: Diagnosis not present

## 2016-12-04 DIAGNOSIS — F411 Generalized anxiety disorder: Secondary | ICD-10-CM | POA: Diagnosis not present

## 2016-12-18 DIAGNOSIS — F411 Generalized anxiety disorder: Secondary | ICD-10-CM | POA: Diagnosis not present

## 2017-01-08 DIAGNOSIS — F411 Generalized anxiety disorder: Secondary | ICD-10-CM | POA: Diagnosis not present

## 2017-02-05 DIAGNOSIS — F4321 Adjustment disorder with depressed mood: Secondary | ICD-10-CM | POA: Diagnosis not present

## 2017-02-16 DIAGNOSIS — F419 Anxiety disorder, unspecified: Secondary | ICD-10-CM | POA: Diagnosis not present

## 2017-02-19 DIAGNOSIS — F411 Generalized anxiety disorder: Secondary | ICD-10-CM | POA: Diagnosis not present

## 2017-03-17 DIAGNOSIS — L309 Dermatitis, unspecified: Secondary | ICD-10-CM | POA: Diagnosis not present

## 2017-03-17 DIAGNOSIS — L858 Other specified epidermal thickening: Secondary | ICD-10-CM | POA: Diagnosis not present

## 2017-03-17 DIAGNOSIS — D229 Melanocytic nevi, unspecified: Secondary | ICD-10-CM | POA: Diagnosis not present

## 2017-03-17 DIAGNOSIS — L700 Acne vulgaris: Secondary | ICD-10-CM | POA: Diagnosis not present

## 2017-03-18 DIAGNOSIS — Z23 Encounter for immunization: Secondary | ICD-10-CM | POA: Diagnosis not present

## 2017-03-19 DIAGNOSIS — F411 Generalized anxiety disorder: Secondary | ICD-10-CM | POA: Diagnosis not present

## 2017-03-24 DIAGNOSIS — F411 Generalized anxiety disorder: Secondary | ICD-10-CM | POA: Diagnosis not present

## 2017-03-27 DIAGNOSIS — F419 Anxiety disorder, unspecified: Secondary | ICD-10-CM | POA: Diagnosis not present

## 2017-04-02 DIAGNOSIS — F411 Generalized anxiety disorder: Secondary | ICD-10-CM | POA: Diagnosis not present

## 2017-04-09 DIAGNOSIS — F411 Generalized anxiety disorder: Secondary | ICD-10-CM | POA: Diagnosis not present

## 2017-04-17 DIAGNOSIS — F411 Generalized anxiety disorder: Secondary | ICD-10-CM | POA: Diagnosis not present

## 2017-04-30 DIAGNOSIS — F411 Generalized anxiety disorder: Secondary | ICD-10-CM | POA: Diagnosis not present

## 2017-05-05 DIAGNOSIS — F411 Generalized anxiety disorder: Secondary | ICD-10-CM | POA: Diagnosis not present

## 2017-05-06 ENCOUNTER — Ambulatory Visit (INDEPENDENT_AMBULATORY_CARE_PROVIDER_SITE_OTHER): Payer: BLUE CROSS/BLUE SHIELD | Admitting: Family Medicine

## 2017-05-06 ENCOUNTER — Other Ambulatory Visit: Payer: Self-pay

## 2017-05-06 ENCOUNTER — Encounter: Payer: Self-pay | Admitting: Family Medicine

## 2017-05-06 VITALS — BP 123/81 | HR 90 | Temp 98.3°F | Resp 16 | Ht 67.0 in | Wt 198.4 lb

## 2017-05-06 DIAGNOSIS — E669 Obesity, unspecified: Secondary | ICD-10-CM

## 2017-05-06 DIAGNOSIS — Z0000 Encounter for general adult medical examination without abnormal findings: Secondary | ICD-10-CM

## 2017-05-06 LAB — HEPATIC FUNCTION PANEL
ALK PHOS: 77 U/L (ref 39–117)
ALT: 14 U/L (ref 0–35)
AST: 17 U/L (ref 0–37)
Albumin: 4.5 g/dL (ref 3.5–5.2)
BILIRUBIN DIRECT: 0.1 mg/dL (ref 0.0–0.3)
BILIRUBIN TOTAL: 0.4 mg/dL (ref 0.2–1.2)
Total Protein: 7.4 g/dL (ref 6.0–8.3)

## 2017-05-06 LAB — CBC WITH DIFFERENTIAL/PLATELET
BASOS PCT: 0.5 % (ref 0.0–3.0)
Basophils Absolute: 0 10*3/uL (ref 0.0–0.1)
EOS ABS: 0.1 10*3/uL (ref 0.0–0.7)
EOS PCT: 0.7 % (ref 0.0–5.0)
HCT: 42.9 % (ref 36.0–46.0)
Hemoglobin: 14 g/dL (ref 12.0–15.0)
LYMPHS ABS: 1.9 10*3/uL (ref 0.7–4.0)
Lymphocytes Relative: 20.9 % (ref 12.0–46.0)
MCHC: 32.8 g/dL (ref 30.0–36.0)
MCV: 89.7 fl (ref 78.0–100.0)
MONO ABS: 0.6 10*3/uL (ref 0.1–1.0)
Monocytes Relative: 7 % (ref 3.0–12.0)
NEUTROS ABS: 6.4 10*3/uL (ref 1.4–7.7)
Neutrophils Relative %: 70.9 % (ref 43.0–77.0)
PLATELETS: 289 10*3/uL (ref 150.0–400.0)
RBC: 4.78 Mil/uL (ref 3.87–5.11)
RDW: 13.7 % (ref 11.5–15.5)
WBC: 9 10*3/uL (ref 4.0–10.5)

## 2017-05-06 LAB — LIPID PANEL
Cholesterol: 175 mg/dL (ref 0–200)
HDL: 37.4 mg/dL — ABNORMAL LOW (ref >39.00)
NONHDL: 137.12
Total CHOL/HDL Ratio: 5
Triglycerides: 259 mg/dL — ABNORMAL HIGH (ref 0.0–149.0)
VLDL: 51.8 mg/dL — ABNORMAL HIGH (ref 0.0–40.0)

## 2017-05-06 LAB — LDL CHOLESTEROL, DIRECT: LDL DIRECT: 110 mg/dL

## 2017-05-06 LAB — BASIC METABOLIC PANEL
BUN: 8 mg/dL (ref 6–23)
CHLORIDE: 105 meq/L (ref 96–112)
CO2: 29 meq/L (ref 19–32)
Calcium: 9.9 mg/dL (ref 8.4–10.5)
Creatinine, Ser: 0.79 mg/dL (ref 0.40–1.20)
GFR: 85.22 mL/min (ref >60.00)
Glucose, Bld: 106 mg/dL — ABNORMAL HIGH (ref 70–99)
POTASSIUM: 4.9 meq/L (ref 3.5–5.1)
SODIUM: 141 meq/L (ref 135–145)

## 2017-05-06 LAB — TSH: TSH: 3.1 u[IU]/mL (ref 0.35–4.50)

## 2017-05-06 NOTE — Assessment & Plan Note (Signed)
Pt's PE WNL w/ exception of obesity.  UTD on GYN.  UTD on immunizations.  Check labs.  Anticipatory guidance provided.

## 2017-05-06 NOTE — Patient Instructions (Signed)
Follow up in 6 months to recheck weight loss progress We'll notify you of your lab results and make any changes if needed Continue to work on healthy diet and regular exercise- you can do it! Call with any questions or concerns Hang in there!!!

## 2017-05-06 NOTE — Progress Notes (Signed)
   Subjective:    Patient ID: Beth Reid, female    DOB: [birth year], 42 y.o.   MRN: [id number]  HPI CPE- UTD on GYN, Tdap, flu.  Pt has gained 15 lbs since last visit.   Review of Systems Patient reports no vision/ hearing changes, adenopathy,fever,  persistant/recurrent hoarseness , swallowing issues, chest pain, palpitations, edema, persistant/recurrent cough, hemoptysis, dyspnea (rest/exertional/paroxysmal nocturnal), gastrointestinal bleeding (melena, rectal bleeding), abdominal pain, significant heartburn, bowel changes, GU symptoms (dysuria, hematuria, incontinence), Gyn symptoms (abnormal  bleeding, pain),  syncope, focal weakness, memory loss, numbness & tingling, skin/hair/nail changes, abnormal bruising or bleeding, anxiety, or depression.     Objective:   Physical Exam General Appearance:    Alert, cooperative, no distress, appears stated age  Head:    Normocephalic, without obvious abnormality, atraumatic  Eyes:    PERRL, conjunctiva/corneas clear, EOM's intact, fundi    benign, both eyes  Ears:    Normal TM's and external ear canals, both ears  Nose:   Nares normal, septum midline, mucosa normal, no drainage    or sinus tenderness  Throat:   Lips, mucosa, and tongue normal; teeth and gums normal  Neck:   Supple, symmetrical, trachea midline, no adenopathy;    Thyroid: no enlargement/tenderness/nodules  Back:     Symmetric, no curvature, ROM normal, no CVA tenderness  Lungs:     Clear to auscultation bilaterally, respirations unlabored  Chest Wall:    No tenderness or deformity   Heart:    Regular rate and rhythm, S1 and S2 normal, no murmur, rub   or gallop  Breast Exam:    Deferred to GYN  Abdomen:     Soft, non-tender, bowel sounds active all four quadrants,    no masses, no organomegaly  Genitalia:    Deferred to GYN  Rectal:    Extremities:   Extremities normal, atraumatic, no cyanosis or edema  Pulses:   2+ and symmetric all extremities  Skin:   Skin color,  texture, turgor normal, no rashes or lesions  Lymph nodes:   Cervical, supraclavicular, and axillary nodes normal  Neurologic:   CNII-XII intact, normal strength, sensation and reflexes    throughout          Assessment & Plan:

## 2017-05-06 NOTE — Assessment & Plan Note (Signed)
Pt has gained 15 lbs since starting Paxil.  Admits to poor diet and lack of exercise.  Stressed the need for healthy food choices and regular exercise.  Check labs to risk stratify.  Will follow.

## 2017-05-07 ENCOUNTER — Encounter: Payer: Self-pay | Admitting: General Practice

## 2017-05-27 DIAGNOSIS — F411 Generalized anxiety disorder: Secondary | ICD-10-CM | POA: Diagnosis not present

## 2017-06-01 ENCOUNTER — Ambulatory Visit: Payer: Self-pay | Admitting: *Deleted

## 2017-06-01 NOTE — Telephone Encounter (Signed)
Attempted to contact pt regarding problems with right ear; left message at [phone].

## 2017-06-01 NOTE — Telephone Encounter (Signed)
Pt reports right ear feels "blocked" x 1 week s/p cold symptoms. Denies pain, states cold symptoms resolved.   Has "flushed" with hydrogen peroxide, saline,ineffective.  Declines appt.. Home care advice given per protocol. Instructed to call back if symptoms persist x 48 hours, worsen.  Reason for Disposition . Ear congestion  Answer Assessment - Initial Assessment Questions 1. LOCATION: "Which ear is involved?"      right 2. SENSATION: "Describe how the ear feels."      Clogged 3. ONSET:  "When did the ear symptoms start?"       1 week ago 4. PAIN: "Do you also have an earache?" If so, ask: "How bad is it?" (Scale 1-10; or mild, moderate, severe)     No 5. CAUSE: "What do you think is causing the ear congestion?"    Had a cold 1 week ago 6. URI: "Do you have a runny nose or cough?"      Cold from 1 week ago resolved 7. NASAL ALLERGIES: "Are there symptoms of hay fever, such as sneezing or a clear nasal discharge?"    No 8. PREGNANCY: "Is there any chance you are pregnant?" "When was your last menstrual period?"     no  Protocols used: EAR - CONGESTION-A-AH

## 2017-06-04 ENCOUNTER — Other Ambulatory Visit: Payer: Self-pay

## 2017-06-04 ENCOUNTER — Ambulatory Visit (INDEPENDENT_AMBULATORY_CARE_PROVIDER_SITE_OTHER): Payer: BLUE CROSS/BLUE SHIELD | Admitting: Family Medicine

## 2017-06-04 ENCOUNTER — Encounter: Payer: Self-pay | Admitting: Family Medicine

## 2017-06-04 VITALS — BP 124/70 | HR 91 | Temp 98.3°F | Ht 67.0 in | Wt 205.2 lb

## 2017-06-04 DIAGNOSIS — H6121 Impacted cerumen, right ear: Secondary | ICD-10-CM | POA: Diagnosis not present

## 2017-06-04 DIAGNOSIS — F411 Generalized anxiety disorder: Secondary | ICD-10-CM | POA: Diagnosis not present

## 2017-06-04 NOTE — Patient Instructions (Signed)
Please follow up if symptoms do not improve or as needed.    Earwax Buildup, Adult The ears produce a substance called earwax that helps keep bacteria out of the ear and protects the skin in the ear canal. Occasionally, earwax can build up in the ear and cause discomfort or hearing loss. What increases the risk? This condition is more likely to develop in people who:  Are female.  Are elderly.  Naturally produce more earwax.  Clean their ears often with cotton swabs.  Use earplugs often.  Use in-ear headphones often.  Wear hearing aids.  Have narrow ear canals.  Have earwax that is overly thick or sticky.  Have eczema.  Are dehydrated.  Have excess hair in the ear canal.  What are the signs or symptoms? Symptoms of this condition include:  Reduced or muffled hearing.  A feeling of fullness in the ear or feeling that the ear is plugged.  Fluid coming from the ear.  Ear pain.  Ear itch.  Ringing in the ear.  Coughing.  An obvious piece of earwax that can be seen inside the ear canal.  How is this diagnosed? This condition may be diagnosed based on:  Your symptoms.  Your medical history.  An ear exam. During the exam, your health care provider will look into your ear with an instrument called an otoscope.  You may have tests, including a hearing test. How is this treated? This condition may be treated by:  Using ear drops to soften the earwax.  Having the earwax removed by a health care provider. The health care provider may: ? Flush the ear with water. ? Use an instrument that has a loop on the end (curette). ? Use a suction device.  Surgery to remove the wax buildup. This may be done in severe cases.  Follow these instructions at home:  Take over-the-counter and prescription medicines only as told by your health care provider.  Do not put any objects, including cotton swabs, into your ear. You can clean the opening of your ear canal with a  washcloth or facial tissue.  Follow instructions from your health care provider about cleaning your ears. Do not over-clean your ears.  Drink enough fluid to keep your urine clear or pale yellow. This will help to thin the earwax.  Keep all follow-up visits as told by your health care provider. If earwax builds up in your ears often or if you use hearing aids, consider seeing your health care provider for routine, preventive ear cleanings. Ask your health care provider how often you should schedule your cleanings.  If you have hearing aids, clean them according to instructions from the manufacturer and your health care provider. Contact a health care provider if:  You have ear pain.  You develop a fever.  You have blood, pus, or other fluid coming from your ear.  You have hearing loss.  You have ringing in your ears that does not go away.  Your symptoms do not improve with treatment.  You feel like the room is spinning (vertigo). Summary  Earwax can build up in the ear and cause discomfort or hearing loss.  The most common symptoms of this condition include reduced or muffled hearing and a feeling of fullness in the ear or feeling that the ear is plugged.  This condition may be diagnosed based on your symptoms, your medical history, and an ear exam.  This condition may be treated by using ear drops to soften the  earwax or by having the earwax removed by a health care provider.  Do not put any objects, including cotton swabs, into your ear. You can clean the opening of your ear canal with a washcloth or facial tissue. This information is not intended to replace advice given to you by your health care provider. Make sure you discuss any questions you have with your health care provider. Document Released: 05/15/2004 Document Revised: 06/18/2016 Document Reviewed: 06/18/2016 Elsevier Interactive Patient Education  [employer]2018 [employer]

## 2017-06-04 NOTE — Progress Notes (Signed)
   Subjective  CC:  Chief Complaint  Patient presents with  . Ear Problem    Patient had a cold, unable to hear out of Right Ear    HPI: Beth KaufmannJessica Reid is a 42 y.o. female who presents to the office today to address the problems listed above in the chief complaint.  Had URI 2 weeks ago, typical sxs except had painless drainage from left ear. Since, cold sxs have resolved but can't hear out of left ear x 1 week. No other sxs.   I reviewed the patients updated PMH, FH, and SocHx.    Patient Active Problem List   Diagnosis Date Noted  . Physical exam 05/06/2017  . Anxiety 09/25/2016  . Obesity (BMI 30.0-34.9) 09/25/2016  . Fatigue 09/25/2016   Current Meds  Medication Sig  . busPIRone (BUSPAR) 15 MG tablet Take 15 mg by mouth 2 (two) times daily.  Marland Kitchen. LORazepam (ATIVAN) 1 MG tablet TK 1/2-1 T PO Q 8 H PRA  . PARoxetine (PAXIL) 20 MG tablet Take 20 mg by mouth daily.    Allergies: Patient is allergic to zoloft  [sertraline hcl]; estrogens; and lexapro  [escitalopram oxalate]. Family History: Patient family history includes Cancer in her mother; Diabetes in her mother; Glaucoma in her mother; Heart disease in her mother. Social History:  Patient  reports that  has never smoked. she has never used smokeless tobacco. She reports that she does not drink alcohol or use drugs.  Review of Systems: Constitutional: Negative for fever malaise or anorexia Cardiovascular: negative for chest pain Respiratory: negative for SOB or persistent cough Gastrointestinal: negative for abdominal pain  Objective  Vitals: BP 124/70   Pulse 91   Temp 98.3 F (36.8 C)   Ht 5\' 7"  (1.702 m)   Wt 205 lb 3.2 oz (93.1 kg)   BMI 32.14 kg/m  General: no acute distress , A&Ox3 HEENT: PEERL, conjunctiva normal, Oropharynx moist,neck is supple. Left TM nl, Right EAC impacted with cerumen Irrigated to remove wax. TM nl on right. Hearing improved  Assessment  1. Hearing loss of right ear due to cerumen  impaction      Plan   Cerumen impaction:  Resolved.   Follow up: prn    Commons side effects, risks, benefits, and alternatives for medications and treatment plan prescribed today were discussed, and the patient expressed understanding of the given instructions. Patient is instructed to call or message via MyChart if he/she has any questions or concerns regarding our treatment plan. No barriers to understanding were identified. We discussed Red Flag symptoms and signs in detail. Patient expressed understanding regarding what to do in case of urgent or emergency type symptoms.   Medication list was reconciled, printed and provided to the patient in AVS. Patient instructions and summary information was reviewed with the patient as documented in the AVS. This note was prepared with assistance of Dragon voice recognition software. Occasional wrong-word or sound-a-like substitutions may have occurred due to the inherent limitations of voice recognition software  No orders of the defined types were placed in this encounter.  No orders of the defined types were placed in this encounter.

## 2017-06-15 ENCOUNTER — Other Ambulatory Visit: Payer: Self-pay

## 2017-06-15 ENCOUNTER — Encounter: Payer: Self-pay | Admitting: Family Medicine

## 2017-06-15 ENCOUNTER — Ambulatory Visit (INDEPENDENT_AMBULATORY_CARE_PROVIDER_SITE_OTHER): Payer: BLUE CROSS/BLUE SHIELD | Admitting: Family Medicine

## 2017-06-15 VITALS — BP 118/82 | HR 79 | Temp 98.3°F | Ht 67.0 in | Wt 202.6 lb

## 2017-06-15 DIAGNOSIS — J029 Acute pharyngitis, unspecified: Secondary | ICD-10-CM

## 2017-06-15 DIAGNOSIS — J028 Acute pharyngitis due to other specified organisms: Secondary | ICD-10-CM | POA: Diagnosis not present

## 2017-06-15 DIAGNOSIS — R52 Pain, unspecified: Secondary | ICD-10-CM

## 2017-06-15 DIAGNOSIS — B9789 Other viral agents as the cause of diseases classified elsewhere: Secondary | ICD-10-CM

## 2017-06-15 LAB — POC INFLUENZA A&B (BINAX/QUICKVUE)
INFLUENZA A, POC: NEGATIVE
Influenza B, POC: NEGATIVE

## 2017-06-15 LAB — POCT RAPID STREP A (OFFICE): Rapid Strep A Screen: NEGATIVE

## 2017-06-15 NOTE — Patient Instructions (Signed)
Please follow up if symptoms do not improve or as needed.    Pharyngitis Pharyngitis is redness, pain, and swelling (inflammation) of the throat (pharynx). It is a very common cause of sore throat. Pharyngitis can be caused by a bacteria, but it is usually caused by a virus. Most cases of pharyngitis get better on their own without treatment. What are the causes? This condition may be caused by:  Infection by viruses (viral). Viral pharyngitis spreads from person to person (is contagious) through coughing, sneezing, and sharing of personal items or utensils such as cups, forks, spoons, and toothbrushes.  Infection by bacteria (bacterial). Bacterial pharyngitis may be spread by touching the nose or face after coming in contact with the bacteria, or through more intimate contact, such as kissing.  Allergies. Allergies can cause buildup of mucus in the throat (post-nasal drip), leading to inflammation and irritation. Allergies can also cause blocked nasal passages, forcing breathing through the mouth, which dries and irritates the throat.  What increases the risk? You are more likely to develop this condition if:  You are 58-43 years old.  You are exposed to crowded environments such as daycare, school, or dormitory living.  You live in a cold climate.  You have a weakened disease-fighting (immune) system.  What are the signs or symptoms? Symptoms of this condition vary by the cause (viral, bacterial, or allergies) and can include:  Sore throat.  Fatigue.  Low-grade fever.  Headache.  Joint pain and muscle aches.  Skin rashes.  Swollen glands in the throat (lymph nodes).  Plaque-like film on the throat or tonsils. This is often a symptom of bacterial pharyngitis.  Vomiting.  Stuffy nose (nasal congestion).  Cough.  Red, itchy eyes (conjunctivitis).  Loss of appetite.  How is this diagnosed? This condition is often diagnosed based on your medical history and a  physical exam. Your health care provider will ask you questions about your illness and your symptoms. A swab of your throat may be done to check for bacteria (rapid strep test). Other lab tests may also be done, depending on the suspected cause, but these are rare. How is this treated? This condition usually gets better in 3-4 days without medicine. Bacterial pharyngitis may be treated with antibiotic medicines. Follow these instructions at home:  Take over-the-counter and prescription medicines only as told by your health care provider. ? If you were prescribed an antibiotic medicine, take it as told by your health care provider. Do not stop taking the antibiotic even if you start to feel better. ? Do not give children aspirin because of the association with Reye syndrome.  Drink enough water and fluids to keep your urine clear or pale yellow.  Get a lot of rest.  Gargle with a salt-water mixture 3-4 times a day or as needed. To make a salt-water mixture, completely dissolve -1 tsp of salt in 1 cup of warm water.  If your health care provider approves, you may use throat lozenges or sprays to soothe your throat. Contact a health care provider if:  You have large, tender lumps in your neck.  You have a rash.  You cough up green, yellow-brown, or bloody spit. Get help right away if:  Your neck becomes stiff.  You drool or are unable to swallow liquids.  You cannot drink or take medicines without vomiting.  You have severe pain that does not go away, even after you take medicine.  You have trouble breathing, and it is not caused  by a stuffy nose.  You have new pain and swelling in your joints such as the knees, ankles, wrists, or elbows. Summary  Pharyngitis is redness, pain, and swelling (inflammation) of the throat (pharynx).  While pharyngitis can be caused by a bacteria, the most common causes are viral.  Most cases of pharyngitis get better on their own without  treatment.  Bacterial pharyngitis is treated with antibiotic medicines. This information is not intended to replace advice given to you by your health care provider. Make sure you discuss any questions you have with your health care provider. Document Released: 04/07/2005 Document Revised: 05/13/2016 Document Reviewed: 05/13/2016 Elsevier Interactive Patient Education  [employer]2018 [employer]

## 2017-06-15 NOTE — Progress Notes (Signed)
Subjective  CC:  Chief Complaint  Patient presents with  . Sore Throat    x 3 days     HPI: Beth Reid is a 42 y.o. female who presents to the office today to address the problems listed above in the chief complaint.  C/o sore throat, mild URI sxs with subjective fever.also had body aches. Started 3 days ago. Has ear pressure.  No SOB or GI sxs. No known exposure ot strep or mono. OTC analgesics have been used with minimal or mild relief. Eating and drinking OK.   I reviewed the patients updated PMH, FH, and SocHx.    Patient Active Problem List   Diagnosis Date Noted  . Physical exam 05/06/2017  . Anxiety 09/25/2016  . Obesity (BMI 30.0-34.9) 09/25/2016  . Fatigue 09/25/2016   Current Meds  Medication Sig  . busPIRone (BUSPAR) 15 MG tablet Take 15 mg by mouth 2 (two) times daily.  Marland Kitchen. LORazepam (ATIVAN) 1 MG tablet TK 1/2-1 T PO Q 8 H PRA  . PARoxetine (PAXIL) 20 MG tablet Take 20 mg by mouth daily.    Allergies: Patient is allergic to zoloft  [sertraline hcl]; estrogens; and lexapro  [escitalopram oxalate].  Review of Systems: Constitutional: Negative for fever malaise or anorexia Cardiovascular: negative for chest pain Respiratory: negative for SOB or persistent cough Gastrointestinal: negative for abdominal pain  Objective  Vitals: There were no vitals taken for this visit. General: no acute distress , A&Ox3 HEENT: PEERL, conjunctiva normal, bilateral EAC and TMs are normal. Nares normal. Oropharynx moist with erythematous posterior pharynx without exudate, + cervical LAD, 2+ tonsils, midline uvula, neck is supple Cardiovascular:  RRR without murmur or gallop.  Respiratory:  Good breath sounds bilaterally, CTAB with normal respiratory effort Skin:  Warm, no rashes  No visits with results within 1 Day(s) from this visit.  Latest known visit with results is:  Office Visit on 05/06/2017  Component Date Value Ref Range Status  . Cholesterol 05/06/2017 175  0 - 200  mg/dL Final  . Triglycerides 05/06/2017 259.0* 0.0 - 149.0 mg/dL Final  . HDL 40/98/119101/16/2019 37.40* >39.00 mg/dL Final  . VLDL 47/82/956201/16/2019 51.8* 0.0 - 40.0 mg/dL Final  . Total CHOL/HDL Ratio 05/06/2017 5   Final  . NonHDL 05/06/2017 137.12   Final  . Sodium 05/06/2017 141  135 - 145 mEq/L Final  . Potassium 05/06/2017 4.9  3.5 - 5.1 mEq/L Final  . Chloride 05/06/2017 105  96 - 112 mEq/L Final  . CO2 05/06/2017 29  19 - 32 mEq/L Final  . Glucose, Bld 05/06/2017 106* 70 - 99 mg/dL Final  . BUN 13/08/657801/16/2019 8  6 - 23 mg/dL Final  . Creatinine, Ser 05/06/2017 0.79  0.40 - 1.20 mg/dL Final  . Calcium 46/96/295201/16/2019 9.9  8.4 - 10.5 mg/dL Final  . GFR 84/13/244001/16/2019 85.22  >60.00 mL/min Final  . TSH 05/06/2017 3.10  0.35 - 4.50 uIU/mL Final  . Total Bilirubin 05/06/2017 0.4  0.2 - 1.2 mg/dL Final  . Bilirubin, Direct 05/06/2017 0.1  0.0 - 0.3 mg/dL Final  . Alkaline Phosphatase 05/06/2017 77  39 - 117 U/L Final  . AST 05/06/2017 17  0 - 37 U/L Final  . ALT 05/06/2017 14  0 - 35 U/L Final  . Total Protein 05/06/2017 7.4  6.0 - 8.3 g/dL Final  . Albumin 10/27/253601/16/2019 4.5  3.5 - 5.2 g/dL Final  . WBC 64/40/347401/16/2019 9.0  4.0 - 10.5 K/uL Final  . RBC 05/06/2017  4.78  3.87 - 5.11 Mil/uL Final  . Hemoglobin 05/06/2017 14.0  12.0 - 15.0 g/dL Final  . HCT 16/01/9603 42.9  36.0 - 46.0 % Final  . MCV 05/06/2017 89.7  78.0 - 100.0 fl Final  . MCHC 05/06/2017 32.8  30.0 - 36.0 g/dL Final  . RDW 54/12/8117 13.7  11.5 - 15.5 % Final  . Platelets 05/06/2017 289.0  150.0 - 400.0 K/uL Final  . Neutrophils Relative % 05/06/2017 70.9  43.0 - 77.0 % Final  . Lymphocytes Relative 05/06/2017 20.9  12.0 - 46.0 % Final  . Monocytes Relative 05/06/2017 7.0  3.0 - 12.0 % Final  . Eosinophils Relative 05/06/2017 0.7  0.0 - 5.0 % Final  . Basophils Relative 05/06/2017 0.5  0.0 - 3.0 % Final  . Neutro Abs 05/06/2017 6.4  1.4 - 7.7 K/uL Final  . Lymphs Abs 05/06/2017 1.9  0.7 - 4.0 K/uL Final  . Monocytes Absolute 05/06/2017 0.6  0.1 -  1.0 K/uL Final  . Eosinophils Absolute 05/06/2017 0.1  0.0 - 0.7 K/uL Final  . Basophils Absolute 05/06/2017 0.0  0.0 - 0.1 K/uL Final  . Direct LDL 05/06/2017 110.0  mg/dL Final    Assessment  No diagnosis found.   Plan   Supportive care with advil, tylenol, gargles etc discussed. . RTO if sxs persist or worsen.   Follow up: prn    Commons side effects, risks, benefits, and alternatives for medications and treatment plan prescribed today were discussed, and the patient expressed understanding of the given instructions. Patient is instructed to call or message via MyChart if he/she has any questions or concerns regarding our treatment plan. No barriers to understanding were identified. We discussed Red Flag symptoms and signs in detail. Patient expressed understanding regarding what to do in case of urgent or emergency type symptoms.   Medication list was reconciled, printed and provided to the patient in AVS. Patient instructions and summary information was reviewed with the patient as documented in the AVS. This note was prepared with assistance of Dragon voice recognition software. Occasional wrong-word or sound-a-like substitutions may have occurred due to the inherent limitations of voice recognition software  No orders of the defined types were placed in this encounter.  No orders of the defined types were placed in this encounter.

## 2017-07-02 DIAGNOSIS — F411 Generalized anxiety disorder: Secondary | ICD-10-CM | POA: Diagnosis not present

## 2017-07-08 DIAGNOSIS — F4321 Adjustment disorder with depressed mood: Secondary | ICD-10-CM | POA: Diagnosis not present

## 2017-07-14 DIAGNOSIS — F410 Panic disorder [episodic paroxysmal anxiety] without agoraphobia: Secondary | ICD-10-CM | POA: Diagnosis not present

## 2017-07-14 DIAGNOSIS — H6123 Impacted cerumen, bilateral: Secondary | ICD-10-CM | POA: Diagnosis not present

## 2017-07-17 DIAGNOSIS — F419 Anxiety disorder, unspecified: Secondary | ICD-10-CM | POA: Diagnosis not present

## 2017-07-31 DIAGNOSIS — Z01419 Encounter for gynecological examination (general) (routine) without abnormal findings: Secondary | ICD-10-CM | POA: Diagnosis not present

## 2017-07-31 DIAGNOSIS — Z6831 Body mass index (BMI) 31.0-31.9, adult: Secondary | ICD-10-CM | POA: Diagnosis not present

## 2017-07-31 DIAGNOSIS — Z1231 Encounter for screening mammogram for malignant neoplasm of breast: Secondary | ICD-10-CM | POA: Diagnosis not present

## 2017-07-31 DIAGNOSIS — Z1151 Encounter for screening for human papillomavirus (HPV): Secondary | ICD-10-CM | POA: Diagnosis not present

## 2017-08-28 DIAGNOSIS — F4321 Adjustment disorder with depressed mood: Secondary | ICD-10-CM | POA: Diagnosis not present

## 2017-10-09 DIAGNOSIS — F419 Anxiety disorder, unspecified: Secondary | ICD-10-CM | POA: Diagnosis not present

## 2018-01-04 DIAGNOSIS — F4321 Adjustment disorder with depressed mood: Secondary | ICD-10-CM | POA: Diagnosis not present

## 2018-01-14 DIAGNOSIS — F4321 Adjustment disorder with depressed mood: Secondary | ICD-10-CM | POA: Diagnosis not present

## 2018-01-19 DIAGNOSIS — F411 Generalized anxiety disorder: Secondary | ICD-10-CM | POA: Diagnosis not present

## 2018-01-22 DIAGNOSIS — F419 Anxiety disorder, unspecified: Secondary | ICD-10-CM | POA: Diagnosis not present

## 2018-01-22 DIAGNOSIS — Z23 Encounter for immunization: Secondary | ICD-10-CM | POA: Diagnosis not present

## 2018-01-22 DIAGNOSIS — F3341 Major depressive disorder, recurrent, in partial remission: Secondary | ICD-10-CM | POA: Diagnosis not present

## 2018-02-01 DIAGNOSIS — F411 Generalized anxiety disorder: Secondary | ICD-10-CM | POA: Diagnosis not present

## 2018-03-10 ENCOUNTER — Encounter: Payer: Self-pay | Admitting: Emergency Medicine

## 2018-03-17 DIAGNOSIS — F411 Generalized anxiety disorder: Secondary | ICD-10-CM | POA: Diagnosis not present

## 2018-03-22 ENCOUNTER — Other Ambulatory Visit: Payer: Self-pay

## 2018-03-22 MED ORDER — BUSPIRONE HCL 15 MG PO TABS: ORAL_TABLET | ORAL | 0 refills | Status: DC

## 2018-03-24 ENCOUNTER — Other Ambulatory Visit: Payer: Self-pay

## 2018-03-24 MED ORDER — PAROXETINE HCL 20 MG PO TABS: 20.0000 mg | ORAL_TABLET | Freq: Every day | ORAL | 0 refills | Status: DC

## 2018-03-26 ENCOUNTER — Encounter: Payer: Self-pay | Admitting: Psychiatry

## 2018-03-26 ENCOUNTER — Ambulatory Visit (INDEPENDENT_AMBULATORY_CARE_PROVIDER_SITE_OTHER): Payer: BLUE CROSS/BLUE SHIELD | Admitting: Psychiatry

## 2018-03-26 VITALS — BP 122/85 | HR 77

## 2018-03-26 DIAGNOSIS — F410 Panic disorder [episodic paroxysmal anxiety] without agoraphobia: Secondary | ICD-10-CM

## 2018-03-26 DIAGNOSIS — F411 Generalized anxiety disorder: Secondary | ICD-10-CM | POA: Diagnosis not present

## 2018-03-26 DIAGNOSIS — F3281 Premenstrual dysphoric disorder: Secondary | ICD-10-CM

## 2018-03-26 MED ORDER — BUSPIRONE HCL 15 MG PO TABS: ORAL_TABLET | ORAL | 1 refills | Status: DC

## 2018-03-26 MED ORDER — PAROXETINE HCL 20 MG PO TABS: 20.0000 mg | ORAL_TABLET | Freq: Every day | ORAL | 1 refills | Status: DC

## 2018-03-26 NOTE — Progress Notes (Signed)
Beth KaufmannJessica Reid [id number] 1975-10-05 42 y.o.  Subjective:   Patient ID:  Beth KaufmannJessica Reid is a 42 y.o. (DOB 1975-10-05) female.  Chief Complaint:  Chief Complaint  Patient presents with  . Anxiety  . Follow-up    h/o depression    HPI Beth Reid presents to the office today for follow-up of anxiety and depression. She reports reports that "I feel like things are fine." Reports that marital counseling has had a significant benefit in her mood and anxiety and she and her husband's relationship have significantly improved. She reports that she has increased anxiety and worsening mood for about 2 days around the time of ovulation. She reports "I immediately feel like no one likes me" and has ended relationships abruptly around the time between ovulation and menses. Reports that her mood is "very dark" during those times and has lower energy and motivation. Reports that she "may have" had some panic s/s around the time of ovulation. She reports that she feels that she can manage panic s/s. She reports worry and anxious thoughts "is not that bad." Denies depressed mood aside from pre-menstrual period. "I don't think I realized how much I was walking around in darkness" in the past. Reports that her energy and motivation have improved. No longer sleeping as excessively. "I don't feel like the weight is completely the medicine." She reports decreased "self-control." Reports that she is not hungry during the day and "I am starving at night." reports insatiable hunger from 6 pm-10 pm.Concentration is adequate except for around the time of menses. Denies SI.   Reports that she has always picked at her skin and that her mother and sister pick at their skin as well. Reports that she has rarely been needing to take Ativan prn.   [facility] female counselor and plans to see her. Continues to see marital therapist.  Mother has Stage IV cancer.   Reports that she will feel  "sick" if she misses paxil or takes it 12 hours.     Review of Systems:  Review of Systems  Gastrointestinal: Positive for nausea.  Musculoskeletal: Negative for gait problem.       Knee pain  Neurological: Negative for tremors.  Psychiatric/Behavioral:       Please refer to HPI    Medications: I have reviewed the patient's current medications.  Current Outpatient Medications  Medication Sig Dispense Refill  . busPIRone (BUSPAR) 15 MG tablet Take 1 Tablet twice daily. 180 tablet 1  . LORazepam (ATIVAN) 1 MG tablet TK 1/2-1 T PO Q 8 H PRA    . PARoxetine (PAXIL) 20 MG tablet Take 1 tablet (20 mg total) by mouth daily. 90 tablet 1   No current facility-administered medications for this visit.     Medication Side Effects: Other: increased appetite, weight gain  Allergies:  Allergies  Allergen Reactions  . Zoloft  [Sertraline Hcl] Itching    Burning of the skin  . Estrogens Anxiety    Panic Attacks  . Epinephrine   . Lexapro  [Escitalopram Oxalate] Nausea Only    Past Medical History:  Diagnosis Date  . Allergy   . Anxiety   . Depression   . Medical history non-contributory     Family History  Problem Relation Age of Onset  . Diabetes Mother   . Heart disease Mother   . Cancer Mother        breast cancer  . Glaucoma Mother   .  Mood Disorder Paternal Grandmother   . Anxiety disorder Brother   . Anxiety disorder Daughter   . Anxiety disorder Sister   . Depression Sister     Social History   Socioeconomic History  . Marital status: Married    Spouse name: Not on file  . Number of children: Not on file  . Years of education: Not on file  . Highest education level: Not on file  Occupational History  . Not on file  Social Needs  . Financial resource strain: Not on file  . Food insecurity:    Worry: Not on file    Inability: Not on file  . Transportation needs:    Medical: Not on file    Non-medical: Not on file  Tobacco Use  . Smoking status: Never  Smoker  . Smokeless tobacco: Never Used  Substance and Sexual Activity  . Alcohol use: No  . Drug use: No  . Sexual activity: Yes  Lifestyle  . Physical activity:    Days per week: Not on file    Minutes per session: Not on file  . Stress: Not on file  Relationships  . Social connections:    Talks on phone: Not on file    Gets together: Not on file    Attends religious service: Not on file    Active member of club or organization: Not on file    Attends meetings of clubs or organizations: Not on file    Relationship status: Not on file  . Intimate partner violence:    Fear of current or ex partner: Not on file    Emotionally abused: Not on file    Physically abused: Not on file    Forced sexual activity: Not on file  Other Topics Concern  . Not on file  Social History Narrative  . Not on file    Past Medical History, Surgical history, Social history, and Family history were reviewed and updated as appropriate.   Please see review of systems for further details on the patient's review from today.   Objective:   Physical Exam:  BP 122/85   Pulse 77   Physical Exam  Constitutional: She is oriented to person, place, and time. She appears well-developed. No distress.  Musculoskeletal: She exhibits no deformity.  Neurological: She is alert and oriented to person, place, and time. Coordination normal.  Psychiatric: Her speech is normal and behavior is normal. Judgment and thought content normal. Her affect is not angry, not blunt, not labile and not inappropriate. Cognition and memory are normal. She does not exhibit a depressed mood. She expresses no homicidal and no suicidal ideation. She expresses no suicidal plans and no homicidal plans.  Anxious mood and affect (Improved compared to past exams) Insight intact. No auditory or visual hallucinations. No delusions.     Lab Review:     Component Value Date/Time   NA 141 05/06/2017 1040   K 4.9 05/06/2017 1040   CL 105  05/06/2017 1040   CO2 29 05/06/2017 1040   GLUCOSE 106 (H) 05/06/2017 1040   BUN 8 05/06/2017 1040   CREATININE 0.79 05/06/2017 1040   CALCIUM 9.9 05/06/2017 1040   PROT 7.4 05/06/2017 1040   ALBUMIN 4.5 05/06/2017 1040   AST 17 05/06/2017 1040   ALT 14 05/06/2017 1040   ALKPHOS 77 05/06/2017 1040   BILITOT 0.4 05/06/2017 1040       Component Value Date/Time   WBC 9.0 05/06/2017 1040   RBC 4.78  05/06/2017 1040   HGB 14.0 05/06/2017 1040   HCT 42.9 05/06/2017 1040   PLT 289.0 05/06/2017 1040   MCV 89.7 05/06/2017 1040   MCH 28.2 12/19/2012 0700   MCHC 32.8 05/06/2017 1040   RDW 13.7 05/06/2017 1040   LYMPHSABS 1.9 05/06/2017 1040   MONOABS 0.6 05/06/2017 1040   EOSABS 0.1 05/06/2017 1040   BASOSABS 0.0 05/06/2017 1040    No results found for: POCLITH, LITHIUM   No results found for: PHENYTOIN, PHENOBARB, VALPROATE, CBMZ   .res Assessment: Plan:   Patient seen for 30 minutes and greater than 50% of visit spent counseling patient in response to her questions about possible treatment options that do not involve medication changes and treatment options for PMDD and possibly to improve weight gain and excessive appetite.  Discussed potential benefits, risks, and side effects of N-acetylcysteine and that this may be helpful for skin picking and possibly compulsions to eat excessively at night.  Recommended 600 mg twice daily.  Also recommended exploring triggers and strategies to decrease compulsive eating with therapist. Panic disorder - Improved with some residual signs and symptoms - Plan: busPIRone (BUSPAR) 15 MG tablet, PARoxetine (PAXIL) 20 MG tablet  Generalized anxiety disorder - Chronic - Plan: busPIRone (BUSPAR) 15 MG tablet, PARoxetine (PAXIL) 20 MG tablet  Premenstrual dysphoric disorder - Chronic.  Some improvement compared to the past with continued residual signs and symptoms - Plan: PARoxetine (PAXIL) 20 MG tablet  Please see After Visit Summary for patient  specific instructions.  Future Appointments  Date Time Provider Department Center  05/10/2018 10:00 AM Sheliah Hatch, MD [facility]  09/24/2018 10:00 AM Corie Chiquito, PMHNP [facility] None    No orders of the defined types were placed in this encounter.     -------------------------------

## 2018-03-30 ENCOUNTER — Telehealth: Payer: Self-pay | Admitting: Psychiatry

## 2018-03-30 DIAGNOSIS — F411 Generalized anxiety disorder: Secondary | ICD-10-CM | POA: Diagnosis not present

## 2018-03-30 NOTE — Telephone Encounter (Signed)
Right before her cycle she feels like she's another person, she just wants to go and hid. Friday when she saw you it was great but starting to ovulate now and that's when it comes on also. Feels like it's more than anxiety, depression too. She says anything extra feels too overwhelming to get done. She's stayed in bed a lot today. Feels weather and winter have a factor too. Says she's so sad that it's painful.   Agreed to increase buspar but was unable to explain to front office staff what else she was feeling. Do you want to add or adjust anything else? She just wanted me to pass everything along to you.

## 2018-03-30 NOTE — Telephone Encounter (Signed)
Patient called to report that she had a very hard weekend after seeing you on Friday.  Felt overwhelmed with life.  In a fog, hard to function when she has her period or ovulates.  Can she increase her buspar and take another med to supplement the buspar when she is in this time?  [facility] [address]  Porschea's phone is [phone]

## 2018-03-30 NOTE — Telephone Encounter (Signed)
Left voicemail with information. Instructed to call back if needing buspar rx

## 2018-04-01 ENCOUNTER — Other Ambulatory Visit: Payer: Self-pay

## 2018-04-01 DIAGNOSIS — F410 Panic disorder [episodic paroxysmal anxiety] without agoraphobia: Secondary | ICD-10-CM

## 2018-04-01 DIAGNOSIS — F411 Generalized anxiety disorder: Secondary | ICD-10-CM

## 2018-04-01 MED ORDER — BUSPIRONE HCL 10 MG PO TABS: 10.0000 mg | ORAL_TABLET | Freq: Two times a day (BID) | ORAL | 0 refills | Status: DC

## 2018-04-01 NOTE — Telephone Encounter (Signed)
Called in additional 10 mg prescription for her to add to her 15 mg bid

## 2018-04-01 NOTE — Telephone Encounter (Signed)
PATIENT LEFT VM STATED Beth Reid TOLD HER SHE CAN INCREASE HER BUSPAR BY 10 MG FOR AM AND PM, PATIENT CURRENTLY HAS 15 MG TABS THAT SHE TAKES IN AM AND PM WANTS TO KNOW HOW THIS IS GOING TO WORK [phone] DOES NOT HAVE APPT,. UNTIL 2020

## 2018-04-02 NOTE — Telephone Encounter (Signed)
TCUUTR LM r/t additional  10 mg of Buspar had been called to Pharmacy.

## 2018-04-09 ENCOUNTER — Encounter: Payer: Self-pay | Admitting: Family Medicine

## 2018-04-09 DIAGNOSIS — F419 Anxiety disorder, unspecified: Secondary | ICD-10-CM | POA: Diagnosis not present

## 2018-04-16 DIAGNOSIS — F419 Anxiety disorder, unspecified: Secondary | ICD-10-CM | POA: Diagnosis not present

## 2018-04-22 ENCOUNTER — Telehealth: Payer: Self-pay | Admitting: Psychiatry

## 2018-04-22 DIAGNOSIS — F411 Generalized anxiety disorder: Secondary | ICD-10-CM

## 2018-04-22 DIAGNOSIS — F3281 Premenstrual dysphoric disorder: Secondary | ICD-10-CM

## 2018-04-22 DIAGNOSIS — F410 Panic disorder [episodic paroxysmal anxiety] without agoraphobia: Secondary | ICD-10-CM

## 2018-04-22 MED ORDER — BUSPIRONE HCL 30 MG PO TABS: 30.0000 mg | ORAL_TABLET | Freq: Two times a day (BID) | ORAL | 1 refills | Status: DC

## 2018-04-22 MED ORDER — PAROXETINE HCL 10 MG PO TABS: 15.0000 mg | ORAL_TABLET | Freq: Every day | ORAL | 0 refills | Status: DC

## 2018-04-22 NOTE — Telephone Encounter (Signed)
Pt called to report that increase in Buspar has been helpful for her anxiety and that she would like to decrease Paxil if possible since it has caused weight gain.   Recommend waiting until she has been on current dose of Buspar for a full 4 weeks to reach full benefit before decreasing Paxil. Script for Buspar 30 mg BID sent to pharmacy. Also sent script for Paxil 10 mg 1.5 tabs for pt to decrease to 15 mg po qd once she has been on current dose of Buspar for 4 weeks.

## 2018-04-23 NOTE — Telephone Encounter (Signed)
Pt verbalized understanding of medications. Will call back to keep updated on how the medicine is working.

## 2018-05-06 ENCOUNTER — Telehealth: Payer: Self-pay | Admitting: Psychiatry

## 2018-05-06 MED ORDER — BUSPIRONE HCL 10 MG PO TABS: 10.0000 mg | ORAL_TABLET | Freq: Two times a day (BID) | ORAL | 0 refills | Status: DC

## 2018-05-06 NOTE — Telephone Encounter (Signed)
Beth Reid left a voice mail saying she would like an rx for Buspar 10mg  called in for #14. She is trying to titrate up to 30mg  as instructed but is not ready yet. Will need the smaller dose for now still.

## 2018-05-10 ENCOUNTER — Encounter: Payer: Self-pay | Admitting: Family Medicine

## 2018-05-19 ENCOUNTER — Telehealth: Payer: Self-pay | Admitting: Psychiatry

## 2018-05-19 NOTE — Telephone Encounter (Signed)
Pt  Left v-mail with update for this week. Started new dose last Sunday (05/16/2018). Everything is working well at this point. Will continue to update weekly.

## 2018-05-27 ENCOUNTER — Telehealth: Payer: Self-pay | Admitting: Psychiatry

## 2018-05-27 NOTE — Telephone Encounter (Signed)
Patient called and said that being on the medicine forr a week now. She feels very emotional ups and downs. Also has family issues that are affecting her and making her sad. It is time for period

## 2018-06-02 ENCOUNTER — Telehealth: Payer: Self-pay | Admitting: Psychiatry

## 2018-06-02 NOTE — Telephone Encounter (Signed)
Patient is feeling angry,sad decreased the Paxil to 15 mg., increased Buspar to 30mg  am and pm patient doesn't want to take the paxil wants to increase Buspar, please call patient the Paxil is causing weight gain doesn't like her life but not feeling suicidal

## 2018-06-02 NOTE — Telephone Encounter (Signed)
Please review message, paper chart on nurse desk if needed.

## 2018-06-03 NOTE — Telephone Encounter (Signed)
Pt is feeling "Hopeless" but not SI". She stated she will go back up to Paxil 20 mg and continue with Buspar 30 mg 2 x daily. Wanted you (J.C.)to know.

## 2018-06-04 NOTE — Telephone Encounter (Signed)
Pt is feeling more hopefull with this "back up"plan. Will let us know when more meds are needed. Thanks

## 2018-06-10 DIAGNOSIS — F411 Generalized anxiety disorder: Secondary | ICD-10-CM | POA: Diagnosis not present

## 2018-06-17 DIAGNOSIS — F419 Anxiety disorder, unspecified: Secondary | ICD-10-CM | POA: Diagnosis not present

## 2018-06-19 DIAGNOSIS — J069 Acute upper respiratory infection, unspecified: Secondary | ICD-10-CM | POA: Diagnosis not present

## 2018-06-19 DIAGNOSIS — B309 Viral conjunctivitis, unspecified: Secondary | ICD-10-CM | POA: Diagnosis not present

## 2018-07-01 DIAGNOSIS — F419 Anxiety disorder, unspecified: Secondary | ICD-10-CM | POA: Diagnosis not present

## 2018-09-02 DIAGNOSIS — F419 Anxiety disorder, unspecified: Secondary | ICD-10-CM | POA: Diagnosis not present

## 2018-09-09 DIAGNOSIS — F419 Anxiety disorder, unspecified: Secondary | ICD-10-CM | POA: Diagnosis not present

## 2018-09-17 DIAGNOSIS — F419 Anxiety disorder, unspecified: Secondary | ICD-10-CM | POA: Diagnosis not present

## 2018-09-23 DIAGNOSIS — F419 Anxiety disorder, unspecified: Secondary | ICD-10-CM | POA: Diagnosis not present

## 2018-09-24 ENCOUNTER — Ambulatory Visit (INDEPENDENT_AMBULATORY_CARE_PROVIDER_SITE_OTHER): Payer: BC Managed Care – PPO | Admitting: Psychiatry

## 2018-09-24 ENCOUNTER — Other Ambulatory Visit: Payer: Self-pay

## 2018-09-24 ENCOUNTER — Encounter: Payer: Self-pay | Admitting: Psychiatry

## 2018-09-24 VITALS — Wt 205.0 lb

## 2018-09-24 DIAGNOSIS — F410 Panic disorder [episodic paroxysmal anxiety] without agoraphobia: Secondary | ICD-10-CM

## 2018-09-24 DIAGNOSIS — F3281 Premenstrual dysphoric disorder: Secondary | ICD-10-CM

## 2018-09-24 DIAGNOSIS — F411 Generalized anxiety disorder: Secondary | ICD-10-CM

## 2018-09-24 MED ORDER — BUSPIRONE HCL 30 MG PO TABS: 30.0000 mg | ORAL_TABLET | Freq: Two times a day (BID) | ORAL | 1 refills | Status: DC

## 2018-09-24 MED ORDER — PAROXETINE HCL 20 MG PO TABS: 20.0000 mg | ORAL_TABLET | Freq: Every day | ORAL | 1 refills | Status: DC

## 2018-09-24 NOTE — Progress Notes (Signed)
Beth Reid [id number] 12-Jan-1976 43 y.o.  Virtual Visit via Telephone Note  I connected with pt on 09/24/18 at 10:00 AM EDT by telephone and verified that I am speaking with the correct person using two identifiers.   I discussed the limitations, risks, security and privacy concerns of performing an evaluation and management service by telephone and the availability of in person appointments. I also discussed with the patient that there may be a patient responsible charge related to this service. The patient expressed understanding and agreed to proceed.   I discussed the assessment and treatment plan with the patient. The patient was provided an opportunity to ask questions and all were answered. The patient agreed with the plan and demonstrated an understanding of the instructions.   The patient was advised to call back or seek an in-person evaluation if the symptoms worsen or if the condition fails to improve as anticipated.  I provided 30 minutes of non-face-to-face time during this encounter.  The patient was located at home.  [facility]   Corie ChiquitoJessica Lilya Smitherman, PMHNP   Subjective:   Patient ID:  Beth KaufmannJessica Mars is a 43 y.o. (DOB 12-Jan-1976) female.  Chief Complaint:  Chief Complaint  Patient presents with  . Anxiety  . Follow-up    h/o Depression    HPI Beth KaufmannJessica Alaniz presents for follow-up of anxiety. "I feel fine." She reports that she has been home with her children. "The meds are fine." Reports decreased anxiety with improved marriage after husband has been in individual and marital counseling. "I feel more calm and less angry." Denies any acute panic attacks and has not used ativan prn in the last 6 months. She reports that she has been better able to articulate negative feelings. She reports that she has experienced some sadness in recent months and attributes this to processing some things from childhood and grieving. Reports that she has  felt "lonely" at times and attributes this to strict quarantine with trying to continue to see her mother who is immune compromised. She reports that she continues to notice some worsening mood s/s around menses and ovulation and also has difficulty with concentration around that time. She reports that she is sleeping well. Reports energy and motivation are lower the week before her period and then is "fine" the remainder of the month. She reports concentration is adequate aside from the week prior to her period. Denies SI.   Reports that her weight has remained elevated. She reports that she has not tried NAC yet. Reports that she will eat compulsively at times. She reports that at times she has "had a panicky feeling that I can't get enough to eat." Has noticed that she feels more full after eating red meat. Reports that she has had some decrease in appetite. Notices some weight loss if eating consistently during the day and not as much at night.   Mother has Stage IV cancer. [facility]. Reports that she is working on increased body awareness and issues related to weight and eating.    Review of Systems:  Review of Systems  Eyes:       Reports periodic eye twitching  Genitourinary:       Reports changes in libido. Reports libido will be lower at times than others.   Musculoskeletal: Negative for gait problem.  Neurological: Negative for tremors.  Psychiatric/Behavioral:       Please refer to HPI    Medications: I  have reviewed the patient's current medications.  Current Outpatient Medications  Medication Sig Dispense Refill  . PARoxetine (PAXIL) 20 MG tablet Take 1 tablet (20 mg total) by mouth daily. 90 tablet 1  . busPIRone (BUSPAR) 30 MG tablet Take 1 tablet (30 mg total) by mouth 2 (two) times daily. 180 tablet 1  . LORazepam (ATIVAN) 1 MG tablet TK 1/2-1 T PO Q 8 H PRA     No current facility-administered medications for this visit.     Medication  Side Effects: None  Allergies:  Allergies  Allergen Reactions  . Zoloft  [Sertraline Hcl] Itching    Burning of the skin  . Estrogens Anxiety    Panic Attacks  . Epinephrine   . Lexapro  [Escitalopram Oxalate] Nausea Only    Past Medical History:  Diagnosis Date  . Allergy   . Anxiety   . Depression   . Medical history non-contributory     Family History  Problem Relation Age of Onset  . Diabetes Mother   . Heart disease Mother   . Cancer Mother        breast cancer  . Glaucoma Mother   . Mood Disorder Paternal Grandmother   . Anxiety disorder Brother   . Anxiety disorder Daughter   . Anxiety disorder Sister   . Depression Sister     Social History   Socioeconomic History  . Marital status: Married    Spouse name: Not on file  . Number of children: Not on file  . Years of education: Not on file  . Highest education level: Not on file  Occupational History  . Not on file  Social Needs  . Financial resource strain: Not on file  . Food insecurity:    Worry: Not on file    Inability: Not on file  . Transportation needs:    Medical: Not on file    Non-medical: Not on file  Tobacco Use  . Smoking status: Never Smoker  . Smokeless tobacco: Never Used  Substance and Sexual Activity  . Alcohol use: No  . Drug use: No  . Sexual activity: Yes  Lifestyle  . Physical activity:    Days per week: Not on file    Minutes per session: Not on file  . Stress: Not on file  Relationships  . Social connections:    Talks on phone: Not on file    Gets together: Not on file    Attends religious service: Not on file    Active member of club or organization: Not on file    Attends meetings of clubs or organizations: Not on file    Relationship status: Not on file  . Intimate partner violence:    Fear of current or ex partner: Not on file    Emotionally abused: Not on file    Physically abused: Not on file    Forced sexual activity: Not on file  Other Topics Concern   . Not on file  Social History Narrative  . Not on file    Past Medical History, Surgical history, Social history, and Family history were reviewed and updated as appropriate.   Please see review of systems for further details on the patient's review from today.   Objective:   Physical Exam:  Wt 205 lb (93 kg)   BMI 32.11 kg/m   Physical Exam Neurological:     Mental Status: She is alert and oriented to person, place, and time.     Cranial  Nerves: No dysarthria.  Psychiatric:        Attention and Perception: Attention normal.        Speech: Speech normal.        Behavior: Behavior is cooperative.        Thought Content: Thought content normal. Thought content is not paranoid or delusional. Thought content does not include homicidal or suicidal ideation. Thought content does not include homicidal or suicidal plan.        Cognition and Memory: Cognition and memory normal.        Judgment: Judgment normal.     Comments: Presents as less anxious compared to past exams.      Lab Review:     Component Value Date/Time   NA 141 05/06/2017 1040   K 4.9 05/06/2017 1040   CL 105 05/06/2017 1040   CO2 29 05/06/2017 1040   GLUCOSE 106 (H) 05/06/2017 1040   BUN 8 05/06/2017 1040   CREATININE 0.79 05/06/2017 1040   CALCIUM 9.9 05/06/2017 1040   PROT 7.4 05/06/2017 1040   ALBUMIN 4.5 05/06/2017 1040   AST 17 05/06/2017 1040   ALT 14 05/06/2017 1040   ALKPHOS 77 05/06/2017 1040   BILITOT 0.4 05/06/2017 1040       Component Value Date/Time   WBC 9.0 05/06/2017 1040   RBC 4.78 05/06/2017 1040   HGB 14.0 05/06/2017 1040   HCT 42.9 05/06/2017 1040   PLT 289.0 05/06/2017 1040   MCV 89.7 05/06/2017 1040   MCH 28.2 12/19/2012 0700   MCHC 32.8 05/06/2017 1040   RDW 13.7 05/06/2017 1040   LYMPHSABS 1.9 05/06/2017 1040   MONOABS 0.6 05/06/2017 1040   EOSABS 0.1 05/06/2017 1040   BASOSABS 0.0 05/06/2017 1040    No results found for: POCLITH, LITHIUM   No results found for:  PHENYTOIN, PHENOBARB, VALPROATE, CBMZ   .res Assessment: Plan:   Continue Paxil 20 mg po qd for anxiety and depression. Continue Buspar 30 mg po BID for anxiety. Continue Ativan prn anxiety. Pt does not need a refill at this time.  Recommend continuing therapy. Recommend pt f/u in 6 months or sooner if clinically indicated.   Generalized anxiety disorder - Plan: busPIRone (BUSPAR) 30 MG tablet  Premenstrual dysphoric disorder  Panic disorder - Improved with some residual signs and symptoms - Plan: PARoxetine (PAXIL) 20 MG tablet  Please see After Visit Summary for patient specific instructions.  No future appointments.  No orders of the defined types were placed in this encounter.     -------------------------------

## 2018-09-30 DIAGNOSIS — F419 Anxiety disorder, unspecified: Secondary | ICD-10-CM | POA: Diagnosis not present

## 2018-10-07 DIAGNOSIS — F419 Anxiety disorder, unspecified: Secondary | ICD-10-CM | POA: Diagnosis not present

## 2018-11-02 DIAGNOSIS — B9789 Other viral agents as the cause of diseases classified elsewhere: Secondary | ICD-10-CM | POA: Diagnosis not present

## 2018-11-02 DIAGNOSIS — J988 Other specified respiratory disorders: Secondary | ICD-10-CM | POA: Diagnosis not present

## 2018-11-05 DIAGNOSIS — F419 Anxiety disorder, unspecified: Secondary | ICD-10-CM | POA: Diagnosis not present

## 2018-11-05 DIAGNOSIS — F339 Major depressive disorder, recurrent, unspecified: Secondary | ICD-10-CM | POA: Diagnosis not present

## 2018-11-12 DIAGNOSIS — F419 Anxiety disorder, unspecified: Secondary | ICD-10-CM | POA: Diagnosis not present

## 2018-11-12 DIAGNOSIS — F339 Major depressive disorder, recurrent, unspecified: Secondary | ICD-10-CM | POA: Diagnosis not present

## 2018-11-29 DIAGNOSIS — F339 Major depressive disorder, recurrent, unspecified: Secondary | ICD-10-CM | POA: Diagnosis not present

## 2018-11-29 DIAGNOSIS — F419 Anxiety disorder, unspecified: Secondary | ICD-10-CM | POA: Diagnosis not present

## 2018-12-03 DIAGNOSIS — Z131 Encounter for screening for diabetes mellitus: Secondary | ICD-10-CM | POA: Diagnosis not present

## 2018-12-03 DIAGNOSIS — Z13220 Encounter for screening for lipoid disorders: Secondary | ICD-10-CM | POA: Diagnosis not present

## 2018-12-03 DIAGNOSIS — Z130 Encounter for screening for diseases of the blood and blood-forming organs and certain disorders involving the immune mechanism: Secondary | ICD-10-CM | POA: Diagnosis not present

## 2018-12-03 DIAGNOSIS — N943 Premenstrual tension syndrome: Secondary | ICD-10-CM | POA: Diagnosis not present

## 2018-12-03 DIAGNOSIS — Z3200 Encounter for pregnancy test, result unknown: Secondary | ICD-10-CM | POA: Diagnosis not present

## 2018-12-03 DIAGNOSIS — Z0000 Encounter for general adult medical examination without abnormal findings: Secondary | ICD-10-CM | POA: Diagnosis not present

## 2018-12-03 DIAGNOSIS — F339 Major depressive disorder, recurrent, unspecified: Secondary | ICD-10-CM | POA: Diagnosis not present

## 2018-12-03 DIAGNOSIS — Z1329 Encounter for screening for other suspected endocrine disorder: Secondary | ICD-10-CM | POA: Diagnosis not present

## 2018-12-16 DIAGNOSIS — Z6827 Body mass index (BMI) 27.0-27.9, adult: Secondary | ICD-10-CM | POA: Diagnosis not present

## 2018-12-16 DIAGNOSIS — Z01419 Encounter for gynecological examination (general) (routine) without abnormal findings: Secondary | ICD-10-CM | POA: Diagnosis not present

## 2018-12-16 DIAGNOSIS — Z1231 Encounter for screening mammogram for malignant neoplasm of breast: Secondary | ICD-10-CM | POA: Diagnosis not present

## 2018-12-17 DIAGNOSIS — F339 Major depressive disorder, recurrent, unspecified: Secondary | ICD-10-CM | POA: Diagnosis not present

## 2018-12-17 DIAGNOSIS — F419 Anxiety disorder, unspecified: Secondary | ICD-10-CM | POA: Diagnosis not present

## 2018-12-23 ENCOUNTER — Telehealth: Payer: Self-pay | Admitting: Psychiatry

## 2018-12-23 NOTE — Telephone Encounter (Signed)
Left detailed message with information and to call back with further questions and concerns

## 2018-12-29 DIAGNOSIS — F339 Major depressive disorder, recurrent, unspecified: Secondary | ICD-10-CM | POA: Diagnosis not present

## 2018-12-29 DIAGNOSIS — F419 Anxiety disorder, unspecified: Secondary | ICD-10-CM | POA: Diagnosis not present

## 2019-01-03 DIAGNOSIS — F419 Anxiety disorder, unspecified: Secondary | ICD-10-CM | POA: Diagnosis not present

## 2019-01-03 DIAGNOSIS — F339 Major depressive disorder, recurrent, unspecified: Secondary | ICD-10-CM | POA: Diagnosis not present

## 2019-01-05 ENCOUNTER — Telehealth: Payer: Self-pay | Admitting: Psychiatry

## 2019-01-05 NOTE — Telephone Encounter (Signed)
Left detailed voicemail on her personal voicemail of recommendation and to call back with further questions or concerns.

## 2019-01-05 NOTE — Telephone Encounter (Signed)
Patient called and said that she has been on the progesterone birth control for 3 weeks now and she doesn't feel sucidal but she does feel angry all the time. The ob gyn suggested that she stop taking the progeseterone but now she wants to know what she can try as a psch med to help with her mood swings. Please give her a call at [phone]

## 2019-01-10 ENCOUNTER — Telehealth: Payer: Self-pay | Admitting: Psychiatry

## 2019-01-10 NOTE — Telephone Encounter (Signed)
Patient called and said that she is still taking the progesterone birth control pills. She said she is going to continue the last 5 days to finish the 30 day supply. She said that she feels annoyed and angry and than she is happier one minute. She said she is less depressed and is not weeping. She just wanted you to be aware

## 2019-01-10 NOTE — Telephone Encounter (Signed)
Noted  

## 2019-01-11 ENCOUNTER — Telehealth: Payer: Self-pay | Admitting: Psychiatry

## 2019-01-11 NOTE — Telephone Encounter (Signed)
Rtc to patient and she just stopped her bcp yesterday, she was having irritability prior. Advised to try ativan she didn't realize it would help with irritability as well. She just wanted to let Breyana know at next visit she's wanting to come off paxil due to weight gain. Informed her Niema was aware and would discuss next week.

## 2019-01-11 NOTE — Telephone Encounter (Signed)
Patient called not doing well. She went off the birth control and she is crying and screaming at the kids. She feels angry inside and upset. She is not suicdal and her husband is home with her. She has an appointment on Monday but wanted to see you sooner. She wants to discuss other options besides paxil because she has gained 80 pounds.. Please call her at [phone]

## 2019-01-17 ENCOUNTER — Other Ambulatory Visit: Payer: Self-pay

## 2019-01-17 ENCOUNTER — Ambulatory Visit (INDEPENDENT_AMBULATORY_CARE_PROVIDER_SITE_OTHER): Payer: BC Managed Care – PPO | Admitting: Psychiatry

## 2019-01-17 ENCOUNTER — Encounter: Payer: Self-pay | Admitting: Psychiatry

## 2019-01-17 VITALS — Wt 209.0 lb

## 2019-01-17 DIAGNOSIS — F3281 Premenstrual dysphoric disorder: Secondary | ICD-10-CM | POA: Diagnosis not present

## 2019-01-17 DIAGNOSIS — F410 Panic disorder [episodic paroxysmal anxiety] without agoraphobia: Secondary | ICD-10-CM | POA: Diagnosis not present

## 2019-01-17 DIAGNOSIS — F411 Generalized anxiety disorder: Secondary | ICD-10-CM

## 2019-01-17 MED ORDER — OXCARBAZEPINE 150 MG PO TABS: ORAL_TABLET | ORAL | 1 refills | Status: DC

## 2019-01-17 NOTE — Progress Notes (Signed)
Beth Reid [id number] [birth year] 43 y.o.  Subjective:   Patient ID:  Beth Reid is a 43 y.o. (DOB 02-06-1976) female.  Chief Complaint:  Chief Complaint  Patient presents with  . Anxiety  . Other    PMDD    HPI Beth Reid presents to the office today for follow-up of anxiety and depression.   She reports that her anxiety has improved significantly with progress in marital counseling. She is concerned about weight gain and has had frequent sweating. Reports that she now takes Paxil and Buspar at 9 am and other Buspar at 3 pm and is no longer sleeping excessively (previously sleeping 12-14 hours a night). Now sleeping 8-9 hours a night. Reports irritability with less sleep. She reports that she will impulsively eat.Will occasionally text certain friends compulsively, even when she recognizes it is excessive. Denies other impulsive behaviors. Notices mood and anxiety are better when she exercises. Reports that she had some panic attacks in July. Reports occ panic attacks immediately before onset of menses.   She reports that immediately after her period she is "happy and feel like I can do life and I am ok." She reports that after ovulation her mood begins to change. She reports that she has severe irritability right before her period. She reports that her mood improves immediately at the onset of menses. Reports that she had severe anger and irritability after starting OCP's in her 30's. Mood significantly improved when she stopped OCP's to try to conceive. She recently started Progesterone and had severe anger and irritability within a few days. She reports that she continued it for 21 days and had several days of severe anger, alternating with "ok mood." She reports that she felt optimistic about the future. Reports that she cried for several days with coming off Progesterone and had passive death wishes and did not want to be alone. She reports that overall mood and anxiety have returned  to baseline, aside from some slight irritation with daughter.   Reports that she has had sadness for the past several years and reports that her mother has terminal cancer and she is estranged from family of origin.   Denies SI.   Children are now in school. Husband is working from home.   Past Psychiatric Medication Trials: Paxil- Severe sexual side effects at 43 mg.  Zoloft Lexapro BuSpar Xanax Ativan Risperidone  Review of Systems:  Review of Systems  Constitutional: Positive for diaphoresis.  Gastrointestinal: Positive for diarrhea.  Musculoskeletal: Negative for gait problem.  Neurological: Negative for tremors.  Psychiatric/Behavioral:       Please refer to HPI    Medications: I have reviewed the patient's current medications.  Current Outpatient Medications  Medication Sig Dispense Refill  . PARoxetine (PAXIL) 20 MG tablet Take 1 tablet (20 mg total) by mouth daily. 90 tablet 1  . busPIRone (BUSPAR) 30 MG tablet Take 1 tablet (30 mg total) by mouth 2 (two) times daily. 180 tablet 1  . LORazepam (ATIVAN) 1 MG tablet TK 1/2-1 T PO Q 8 H PRA    . OXcarbazepine (TRILEPTAL) 150 MG tablet Take 1/2 tab po QHS x 3-5 days, then 1 tab po QHS x 3-5 days, then 2 tabs po QHS 60 tablet 1   No current facility-administered medications for this visit.     Medication Side Effects: Other: Increased app, wt gain, increased sweating  Allergies:  Allergies  Allergen Reactions  . Zoloft  [Sertraline Hcl] Itching    Burning of the  skin  . Estrogens Anxiety    Panic Attacks  . Epinephrine   . Lexapro  [Escitalopram Oxalate] Nausea Only    Past Medical History:  Diagnosis Date  . Allergy   . Anxiety   . Depression   . Medical history non-contributory     Family History  Problem Relation Age of Onset  . Diabetes Mother   . Heart disease Mother   . Cancer Mother        breast cancer  . Glaucoma Mother   . Mood Disorder Paternal Grandmother   . Anxiety disorder Brother    . Anxiety disorder Daughter   . Anxiety disorder Sister   . Depression Sister     Social History   Socioeconomic History  . Marital status: Married    Spouse name: Not on file  . Number of children: Not on file  . Years of education: Not on file  . Highest education level: Not on file  Occupational History  . Not on file  Social Needs  . Financial resource strain: Not on file  . Food insecurity    Worry: Not on file    Inability: Not on file  . Transportation needs    Medical: Not on file    Non-medical: Not on file  Tobacco Use  . Smoking status: Never Smoker  . Smokeless tobacco: Never Used  Substance and Sexual Activity  . Alcohol use: No  . Drug use: No  . Sexual activity: Yes  Lifestyle  . Physical activity    Days per week: Not on file    Minutes per session: Not on file  . Stress: Not on file  Relationships  . Social Musicianconnections    Talks on phone: Not on file    Gets together: Not on file    Attends religious service: Not on file    Active member of club or organization: Not on file    Attends meetings of clubs or organizations: Not on file    Relationship status: Not on file  . Intimate partner violence    Fear of current or ex partner: Not on file    Emotionally abused: Not on file    Physically abused: Not on file    Forced sexual activity: Not on file  Other Topics Concern  . Not on file  Social History Narrative  . Not on file    Past Medical History, Surgical history, Social history, and Family history were reviewed and updated as appropriate.   Please see review of systems for further details on the patient's review from today.   Objective:   Physical Exam:  Wt 209 lb (94.8 kg)   BMI 32.73 kg/m   Physical Exam Constitutional:      General: She is not in acute distress.    Appearance: She is well-developed.  Musculoskeletal:        General: No deformity.  Neurological:     Mental Status: She is alert and oriented to person, place,  and time.     Coordination: Coordination normal.  Psychiatric:        Attention and Perception: Attention and perception normal. She does not perceive auditory or visual hallucinations.        Mood and Affect: Mood is anxious. Mood is not depressed. Affect is not labile, blunt, angry or inappropriate.        Speech: Speech normal.        Behavior: Behavior normal.  Thought Content: Thought content normal. Thought content is not paranoid or delusional. Thought content does not include homicidal or suicidal ideation. Thought content does not include homicidal or suicidal plan.        Cognition and Memory: Cognition and memory normal.        Judgment: Judgment normal.     Comments: Insight intact. No delusions.      Lab Review:     Component Value Date/Time   NA 141 05/06/2017 1040   K 4.9 05/06/2017 1040   CL 105 05/06/2017 1040   CO2 29 05/06/2017 1040   GLUCOSE 106 (H) 05/06/2017 1040   BUN 8 05/06/2017 1040   CREATININE 0.79 05/06/2017 1040   CALCIUM 9.9 05/06/2017 1040   PROT 7.4 05/06/2017 1040   ALBUMIN 4.5 05/06/2017 1040   AST 17 05/06/2017 1040   ALT 14 05/06/2017 1040   ALKPHOS 77 05/06/2017 1040   BILITOT 0.4 05/06/2017 1040       Component Value Date/Time   WBC 9.0 05/06/2017 1040   RBC 4.78 05/06/2017 1040   HGB 14.0 05/06/2017 1040   HCT 42.9 05/06/2017 1040   PLT 289.0 05/06/2017 1040   MCV 89.7 05/06/2017 1040   MCH 28.2 12/19/2012 0700   MCHC 32.8 05/06/2017 1040   RDW 13.7 05/06/2017 1040   LYMPHSABS 1.9 05/06/2017 1040   MONOABS 0.6 05/06/2017 1040   EOSABS 0.1 05/06/2017 1040   BASOSABS 0.0 05/06/2017 1040    No results found for: POCLITH, LITHIUM   No results found for: PHENYTOIN, PHENOBARB, VALPROATE, CBMZ   .res Assessment: Plan:   Patient seen for 30 minutes and greater than 50% of visit spent counseling patient to include discussing treatment approaches to PMDD and discussed that increasing Paxil for 2 weeks between ovulation and  the onset of menses would likely not be recommended considering history of her experiencing significant discontinuation signs and symptoms with decreases in Paxil dosage.  Discussed that changing Paxil to another SSRI may increase risk of some tolerability issues since she has had adverse reactions to several SSRIs in the past.  Discussed therefore considering addition of a mood stabilizer, such as Trileptal or Lamictal, to help with mood reactivity and fluctuations in mood in the context of her menstrual cycle.  Discussed potential benefits, risks, and side effects of Lamictal and Trileptal with patient.  Patient agrees to trial of Trileptal.  Discussed starting with low dose and gradually increasing dose based on history of medication sensitivity. Will continue all other medications as prescribed. Patient to follow-up with this provider in 1 to 2 months or sooner if clinically indicated. Patient advised to contact office with any questions, adverse effects, or acute worsening in signs and symptoms.  Beth Reid was seen today for anxiety and other.  Diagnoses and all orders for this visit:  Premenstrual dysphoric disorder -     OXcarbazepine (TRILEPTAL) 150 MG tablet; Take 1/2 tab po QHS x 3-5 days, then 1 tab po QHS x 3-5 days, then 2 tabs po QHS  Generalized anxiety disorder -     OXcarbazepine (TRILEPTAL) 150 MG tablet; Take 1/2 tab po QHS x 3-5 days, then 1 tab po QHS x 3-5 days, then 2 tabs po QHS  Panic disorder -     OXcarbazepine (TRILEPTAL) 150 MG tablet; Take 1/2 tab po QHS x 3-5 days, then 1 tab po QHS x 3-5 days, then 2 tabs po QHS     Please see After Visit Summary for patient specific instructions.  Future Appointments  Date Time Provider Department Center  02/14/2019  8:30 AM Corie Chiquito, PMHNP [facility] None    No orders of the defined types were placed in this encounter.   -------------------------------

## 2019-01-19 DIAGNOSIS — F339 Major depressive disorder, recurrent, unspecified: Secondary | ICD-10-CM | POA: Diagnosis not present

## 2019-01-19 DIAGNOSIS — F419 Anxiety disorder, unspecified: Secondary | ICD-10-CM | POA: Diagnosis not present

## 2019-01-20 ENCOUNTER — Telehealth: Payer: Self-pay | Admitting: Psychiatry

## 2019-01-20 NOTE — Telephone Encounter (Signed)
Patient called and left a message stating that she has not started the new medicine until Sunday since she is out of town and wanted to be home and with her husband. Also, her ob gyn has asked if she can start wellbutrin to help with weightloss? She knows it is not anssi and wants to know what you think about her taking this? Will it make her worse? Please give her a call at [phone]

## 2019-01-21 ENCOUNTER — Telehealth: Payer: Self-pay | Admitting: Psychiatry

## 2019-01-21 NOTE — Telephone Encounter (Signed)
Patient called and left a message stating that she called the office of dr. Renee Harder he wasn't taking new patients however if yu send a referral that might help her get in with him. Please let her know when you fax it to the [place] [facility]

## 2019-01-21 NOTE — Telephone Encounter (Signed)
Patient given information and she is going to go ahead with Trileptal for right now starting on Monday I believe, she will follow up with [place] on how she's doing.

## 2019-01-25 NOTE — Telephone Encounter (Signed)
Pt called to report started Trileptal 150 mg tab 1/2 tab first 3-5 days. Monday night first dose. She is feeling a bit light headed, fuzzy.

## 2019-01-27 ENCOUNTER — Telehealth: Payer: Self-pay | Admitting: Psychiatry

## 2019-01-27 NOTE — Telephone Encounter (Signed)
Patient notified and will follow up on 02/14/2019 as scheduled

## 2019-01-27 NOTE — Telephone Encounter (Signed)
Pt started on new medication and she is not feeling well. She is dizzy and her head is swimming. Please call at [phone].

## 2019-01-31 ENCOUNTER — Telehealth: Payer: Self-pay | Admitting: Psychiatry

## 2019-01-31 NOTE — Telephone Encounter (Signed)
Pt left voicemail stating she is feeling very sad and depressed,but not suicidal. She is also inquiring if she can use CBD oil with her current medication. Please advise.

## 2019-02-01 NOTE — Telephone Encounter (Signed)
Pt. Made aware.

## 2019-02-08 ENCOUNTER — Telehealth: Payer: Self-pay | Admitting: Psychiatry

## 2019-02-08 DIAGNOSIS — F411 Generalized anxiety disorder: Secondary | ICD-10-CM | POA: Diagnosis not present

## 2019-02-08 NOTE — Telephone Encounter (Signed)
Pt has called and cancelled the follow up appt on 10/26. She has stopped the medications and thinks she will just try CBD oil for now. She says that it is stressful on her family trying different medications and needs to take a break. No RS for now.

## 2019-02-14 ENCOUNTER — Ambulatory Visit: Payer: BC Managed Care – PPO | Admitting: Psychiatry

## 2019-02-18 DIAGNOSIS — Z0000 Encounter for general adult medical examination without abnormal findings: Secondary | ICD-10-CM | POA: Diagnosis not present

## 2019-02-24 DIAGNOSIS — F418 Other specified anxiety disorders: Secondary | ICD-10-CM | POA: Diagnosis not present

## 2019-03-03 DIAGNOSIS — F411 Generalized anxiety disorder: Secondary | ICD-10-CM | POA: Diagnosis not present

## 2019-03-10 DIAGNOSIS — F411 Generalized anxiety disorder: Secondary | ICD-10-CM | POA: Diagnosis not present

## 2019-03-14 DIAGNOSIS — F411 Generalized anxiety disorder: Secondary | ICD-10-CM | POA: Diagnosis not present

## 2019-03-16 ENCOUNTER — Telehealth: Payer: Self-pay | Admitting: Psychiatry

## 2019-03-16 DIAGNOSIS — F410 Panic disorder [episodic paroxysmal anxiety] without agoraphobia: Secondary | ICD-10-CM

## 2019-03-16 NOTE — Telephone Encounter (Signed)
Looks like there have been a number of medicine changes over time to try to address this and she has had side effects with higher doses of Paxil.  I think Beth Reid will have to look at this and make a decision.  I will be happy to discuss it with her.

## 2019-03-16 NOTE — Telephone Encounter (Signed)
Beth Reid called to report that her medications don't seem to working.  She has always had problems with them helping during her menstrual cycles, but now the problems seem to continue.  She is experiencing fear, emotions are shaky, very worried.  She has been taking some Ativan to help cut the edge but knows she can't continue to do this.  Perhaps there is another medication change or adjustment that can be made to help.  Please call.  She does not have a follow up appt scheduled at this time

## 2019-03-21 MED ORDER — PAROXETINE HCL 20 MG PO TABS: ORAL_TABLET | ORAL | 0 refills | Status: DC

## 2019-03-21 NOTE — Telephone Encounter (Signed)
I called Beth Reid to get her scheduled for follow up appt to discuss medications.  She said that she really just wants to know if it would be ok to increase her paxil the week before her period to see if that will help during her cycle.  Once her cycles are over she is fine.  Would it ok for her try this again just the week before her cycle?  She is hoping you can answer this question over the phone, because appts are expensive for her/

## 2019-03-22 NOTE — Telephone Encounter (Signed)
Rtc to Beth Reid and she was appreciative of the adjustment. Ever since she stopped progesterone her cycles have been irregular. I informed her that was normal after coming off of the BCP's and it should get back to her normal cycle soon. She said she's doing well after her cycle ends. She has a refill of Paxil to pick up today and is good with meds for now. Instructed to call back if symptoms worsening.

## 2019-03-29 DIAGNOSIS — F330 Major depressive disorder, recurrent, mild: Secondary | ICD-10-CM | POA: Diagnosis not present

## 2019-04-07 DIAGNOSIS — F330 Major depressive disorder, recurrent, mild: Secondary | ICD-10-CM | POA: Diagnosis not present

## 2019-04-17 ENCOUNTER — Other Ambulatory Visit: Payer: Self-pay | Admitting: Psychiatry

## 2019-04-17 DIAGNOSIS — F411 Generalized anxiety disorder: Secondary | ICD-10-CM

## 2019-04-18 ENCOUNTER — Other Ambulatory Visit: Payer: Self-pay | Admitting: Psychiatry

## 2019-04-18 DIAGNOSIS — F411 Generalized anxiety disorder: Secondary | ICD-10-CM

## 2019-04-27 DIAGNOSIS — F330 Major depressive disorder, recurrent, mild: Secondary | ICD-10-CM | POA: Diagnosis not present

## 2019-05-03 DIAGNOSIS — F330 Major depressive disorder, recurrent, mild: Secondary | ICD-10-CM | POA: Diagnosis not present

## 2019-05-10 DIAGNOSIS — F330 Major depressive disorder, recurrent, mild: Secondary | ICD-10-CM | POA: Diagnosis not present

## 2019-05-17 DIAGNOSIS — F330 Major depressive disorder, recurrent, mild: Secondary | ICD-10-CM | POA: Diagnosis not present

## 2019-05-18 DIAGNOSIS — N951 Menopausal and female climacteric states: Secondary | ICD-10-CM | POA: Diagnosis not present

## 2019-05-18 DIAGNOSIS — R5383 Other fatigue: Secondary | ICD-10-CM | POA: Diagnosis not present

## 2019-05-18 DIAGNOSIS — R635 Abnormal weight gain: Secondary | ICD-10-CM | POA: Diagnosis not present

## 2019-05-18 DIAGNOSIS — E782 Mixed hyperlipidemia: Secondary | ICD-10-CM | POA: Diagnosis not present

## 2019-05-24 DIAGNOSIS — F330 Major depressive disorder, recurrent, mild: Secondary | ICD-10-CM | POA: Diagnosis not present

## 2019-05-25 DIAGNOSIS — R4586 Emotional lability: Secondary | ICD-10-CM | POA: Diagnosis not present

## 2019-05-25 DIAGNOSIS — N951 Menopausal and female climacteric states: Secondary | ICD-10-CM | POA: Diagnosis not present

## 2019-05-25 DIAGNOSIS — R232 Flushing: Secondary | ICD-10-CM | POA: Diagnosis not present

## 2019-05-25 DIAGNOSIS — Z1339 Encounter for screening examination for other mental health and behavioral disorders: Secondary | ICD-10-CM | POA: Diagnosis not present

## 2019-05-25 DIAGNOSIS — Z1331 Encounter for screening for depression: Secondary | ICD-10-CM | POA: Diagnosis not present

## 2019-05-25 DIAGNOSIS — R5383 Other fatigue: Secondary | ICD-10-CM | POA: Diagnosis not present

## 2019-05-31 DIAGNOSIS — F330 Major depressive disorder, recurrent, mild: Secondary | ICD-10-CM | POA: Diagnosis not present

## 2019-06-03 DIAGNOSIS — E782 Mixed hyperlipidemia: Secondary | ICD-10-CM | POA: Diagnosis not present

## 2019-06-03 DIAGNOSIS — Z6832 Body mass index (BMI) 32.0-32.9, adult: Secondary | ICD-10-CM | POA: Diagnosis not present

## 2019-06-07 DIAGNOSIS — F330 Major depressive disorder, recurrent, mild: Secondary | ICD-10-CM | POA: Diagnosis not present

## 2019-06-14 DIAGNOSIS — F330 Major depressive disorder, recurrent, mild: Secondary | ICD-10-CM | POA: Diagnosis not present

## 2019-06-21 DIAGNOSIS — F330 Major depressive disorder, recurrent, mild: Secondary | ICD-10-CM | POA: Diagnosis not present

## 2019-06-24 ENCOUNTER — Telehealth: Payer: Self-pay | Admitting: Psychiatry

## 2019-06-24 ENCOUNTER — Other Ambulatory Visit: Payer: Self-pay

## 2019-06-24 DIAGNOSIS — F411 Generalized anxiety disorder: Secondary | ICD-10-CM

## 2019-06-24 MED ORDER — BUSPIRONE HCL 30 MG PO TABS: ORAL_TABLET | ORAL | 0 refills | Status: DC

## 2019-06-24 NOTE — Telephone Encounter (Signed)
RX sent for Buspar

## 2019-06-24 NOTE — Telephone Encounter (Signed)
Patient called and said that she needs a refill on her buspar. She made an appointment for  3/17 to the [facility] at [address] in [place]

## 2019-06-28 DIAGNOSIS — F330 Major depressive disorder, recurrent, mild: Secondary | ICD-10-CM | POA: Diagnosis not present

## 2019-07-05 DIAGNOSIS — F330 Major depressive disorder, recurrent, mild: Secondary | ICD-10-CM | POA: Diagnosis not present

## 2019-07-06 ENCOUNTER — Encounter: Payer: Self-pay | Admitting: Psychiatry

## 2019-07-06 ENCOUNTER — Other Ambulatory Visit: Payer: Self-pay

## 2019-07-06 ENCOUNTER — Ambulatory Visit (INDEPENDENT_AMBULATORY_CARE_PROVIDER_SITE_OTHER): Payer: BC Managed Care – PPO | Admitting: Psychiatry

## 2019-07-06 VITALS — Wt 208.0 lb

## 2019-07-06 DIAGNOSIS — F411 Generalized anxiety disorder: Secondary | ICD-10-CM

## 2019-07-06 DIAGNOSIS — F3281 Premenstrual dysphoric disorder: Secondary | ICD-10-CM | POA: Diagnosis not present

## 2019-07-06 DIAGNOSIS — F410 Panic disorder [episodic paroxysmal anxiety] without agoraphobia: Secondary | ICD-10-CM

## 2019-07-06 MED ORDER — BUSPIRONE HCL 30 MG PO TABS: ORAL_TABLET | ORAL | 0 refills | Status: DC

## 2019-07-06 MED ORDER — LORAZEPAM 1 MG PO TABS: 1.0000 mg | ORAL_TABLET | Freq: Four times a day (QID) | ORAL | 2 refills | Status: DC | PRN

## 2019-07-06 MED ORDER — PAROXETINE HCL 20 MG PO TABS: 20.0000 mg | ORAL_TABLET | Freq: Every day | ORAL | 1 refills | Status: DC

## 2019-07-06 NOTE — Progress Notes (Signed)
Beth Reid [id number] 03/03/1976 44 y.o.  Subjective:   Patient ID:  Beth Reid is a 44 y.o. (DOB 1975-10-27) female.  Chief Complaint:  Chief Complaint  Patient presents with  . Follow-up    PMDD, h/o Anxiety and depression    HPI Beth Reid presents to the office today for follow-up of anxiety, PMDD, and depression. "I feel like I am the best I have been on this medicine." She reports that she has felt better on Buspar. She reports that PMDD s/s have not lasted as long as it used to. She reports that she started her period on Thursday and continues to have some residual s/s. Typically now having about 3 days of PMDD s/s. She reports that she has severe fatigue when she has PMDD s/s.   She reports that she is able to function throughout the day. She has been driving her children to different places and making dinner. Has been exercising more. She reports that exercising 30 minutes twice a day seems to help with anxiety, except for around her period. She reports that she has hunger cravings and feels as if her blood sugar will drop and will need to eat.   She reports that overall mood has been stable. She reports that her sleep has been adequate. She reports that her mood and anxiety are better controlled with uninterrupted sleep. Energy and motivation have been good most of the time. Concentration is adequate. Denies SI.   Reports that she is now taking Ativan prn more, about 2-3 times a month, instead of trying not to use it.   Continues to see therapist. Reports that she tried [facility] and [employer] and this was not helpful with weight loss.   Has stopped drinking coffee and was previously drinking one cup in the morning.   Reports that sister has PMDD s/s.  Past Psychiatric Medication Trials: Paxil- Severe sexual side effects at 30 mg.  Zoloft Lexapro BuSpar Xanax Ativan Risperidone  PHQ2-9     Office Visit from 06/15/2017 in [facility] Office Visit from 06/04/2017 in [facility] Visit from 05/06/2017 in [facility] Visit from 09/25/2016 in [facility]  PHQ-2 Total Score  0  0  1  0  PHQ-9 Total Score  --  --  1  0       Review of Systems:  Review of Systems  Gastrointestinal:       Has learned that she is  Lactose intolerant after buying lactose free milk. Now having less diarrhea and nausea.   Musculoskeletal: Negative for gait problem.  Neurological: Negative for tremors.       HA's and migraines have improved with stopping caffeine and dairy  Psychiatric/Behavioral:       Please refer to HPI    Medications: I have reviewed the patient's current medications.  Current Outpatient Medications  Medication Sig Dispense Refill  . busPIRone (BUSPAR) 30 MG tablet TAKE 1 TABLET(30 MG) BY MOUTH TWICE DAILY 180 tablet 0  . LORazepam (ATIVAN) 1 MG tablet Take 1 tablet (1 mg total) by mouth every 6 (six) hours as needed for anxiety. 30 tablet 2  . PARoxetine (PAXIL) 20 MG tablet Take 1 tablet (20 mg total) by mouth daily. 90 tablet 1   No current facility-administered medications for this visit.    Medication Side Effects: Other: Sexual side effects  Allergies:  Allergies  Allergen Reactions  .  Zoloft  [Sertraline Hcl] Itching    Burning of the skin  . Estrogens Anxiety    Panic Attacks  . Epinephrine   . Lexapro  [Escitalopram Oxalate] Nausea Only    Past Medical History:  Diagnosis Date  . Allergy   . Anxiety   . Depression   . Medical history non-contributory     Family History  Problem Relation Age of Onset  . Diabetes Mother   . Heart disease Mother   . Cancer Mother        breast cancer  . Glaucoma Mother   . Mood Disorder Paternal Grandmother   . Anxiety disorder Brother   . Anxiety disorder Daughter   . Anxiety disorder Sister   .  Depression Sister     Social History   Socioeconomic History  . Marital status: Married    Spouse name: Not on file  . Number of children: Not on file  . Years of education: Not on file  . Highest education level: Not on file  Occupational History  . Not on file  Tobacco Use  . Smoking status: Never Smoker  . Smokeless tobacco: Never Used  Substance and Sexual Activity  . Alcohol use: No  . Drug use: No  . Sexual activity: Yes  Other Topics Concern  . Not on file  Social History Narrative  . Not on file   Social Determinants of Health   Financial Resource Strain:   . Difficulty of Paying Living Expenses:   Food Insecurity:   . Worried About Programme researcher, broadcasting/film/video in the Last Year:   . Barista in the Last Year:   Transportation Needs:   . Freight forwarder (Medical):   Marland Kitchen Lack of Transportation (Non-Medical):   Physical Activity:   . Days of Exercise per Week:   . Minutes of Exercise per Session:   Stress:   . Feeling of Stress :   Social Connections:   . Frequency of Communication with Friends and Family:   . Frequency of Social Gatherings with Friends and Family:   . Attends Religious Services:   . Active Member of Clubs or Organizations:   . Attends Banker Meetings:   Marland Kitchen Marital Status:   Intimate Partner Violence:   . Fear of Current or Ex-Partner:   . Emotionally Abused:   Marland Kitchen Physically Abused:   . Sexually Abused:     Past Medical History, Surgical history, Social history, and Family history were reviewed and updated as appropriate.   Please see review of systems for further details on the patient's review from today.   Objective:   Physical Exam:  Wt 208 lb (94.3 kg)   BMI 32.58 kg/m   Physical Exam Constitutional:      General: She is not in acute distress. Musculoskeletal:        General: No deformity.  Neurological:     Mental Status: She is alert and oriented to person, place, and time.     Coordination:  Coordination normal.  Psychiatric:        Attention and Perception: Attention and perception normal. She does not perceive auditory or visual hallucinations.        Mood and Affect: Mood is anxious. Mood is not depressed. Affect is not labile, blunt, angry or inappropriate.        Speech: Speech normal.        Behavior: Behavior normal.        Thought Content:  Thought content normal. Thought content is not paranoid or delusional. Thought content does not include homicidal or suicidal ideation. Thought content does not include homicidal or suicidal plan.        Cognition and Memory: Cognition and memory normal.        Judgment: Judgment normal.     Comments: Insight intact Mood presents as less anxious compared to past exams     Lab Review:     Component Value Date/Time   NA 141 05/06/2017 1040   K 4.9 05/06/2017 1040   CL 105 05/06/2017 1040   CO2 29 05/06/2017 1040   GLUCOSE 106 (H) 05/06/2017 1040   BUN 8 05/06/2017 1040   CREATININE 0.79 05/06/2017 1040   CALCIUM 9.9 05/06/2017 1040   PROT 7.4 05/06/2017 1040   ALBUMIN 4.5 05/06/2017 1040   AST 17 05/06/2017 1040   ALT 14 05/06/2017 1040   ALKPHOS 77 05/06/2017 1040   BILITOT 0.4 05/06/2017 1040       Component Value Date/Time   WBC 9.0 05/06/2017 1040   RBC 4.78 05/06/2017 1040   HGB 14.0 05/06/2017 1040   HCT 42.9 05/06/2017 1040   PLT 289.0 05/06/2017 1040   MCV 89.7 05/06/2017 1040   MCH 28.2 12/19/2012 0700   MCHC 32.8 05/06/2017 1040   RDW 13.7 05/06/2017 1040   LYMPHSABS 1.9 05/06/2017 1040   MONOABS 0.6 05/06/2017 1040   EOSABS 0.1 05/06/2017 1040   BASOSABS 0.0 05/06/2017 1040    No results found for: POCLITH, LITHIUM   No results found for: PHENYTOIN, PHENOBARB, VALPROATE, CBMZ   .res Assessment: Plan:   Will continue current plan of care since target signs and symptoms are well controlled without any tolerability issues. Provided patient with information regarding premenstrual dysphoric  disorder. Patient to follow-up in 6 months or sooner if indicated. Patient advised to contact office with any questions, adverse effects, or acute worsening in signs and symptoms.  Beth Reid was seen today for follow-up.  Diagnoses and all orders for this visit:  Premenstrual dysphoric disorder -     PARoxetine (PAXIL) 20 MG tablet; Take 1 tablet (20 mg total) by mouth daily. -     LORazepam (ATIVAN) 1 MG tablet; Take 1 tablet (1 mg total) by mouth every 6 (six) hours as needed for anxiety.  Generalized anxiety disorder -     busPIRone (BUSPAR) 30 MG tablet; TAKE 1 TABLET(30 MG) BY MOUTH TWICE DAILY -     LORazepam (ATIVAN) 1 MG tablet; Take 1 tablet (1 mg total) by mouth every 6 (six) hours as needed for anxiety.  Panic disorder -     PARoxetine (PAXIL) 20 MG tablet; Take 1 tablet (20 mg total) by mouth daily. -     LORazepam (ATIVAN) 1 MG tablet; Take 1 tablet (1 mg total) by mouth every 6 (six) hours as needed for anxiety.     Please see After Visit Summary for patient specific instructions.  Future Appointments  Date Time Provider [place]  01/06/2020  9:00 AM Thayer Headings, PMHNP [facility] None    No orders of the defined types were placed in this encounter.   -------------------------------

## 2019-07-12 DIAGNOSIS — F330 Major depressive disorder, recurrent, mild: Secondary | ICD-10-CM | POA: Diagnosis not present

## 2019-07-19 ENCOUNTER — Telehealth: Payer: Self-pay | Admitting: Psychiatry

## 2019-07-19 DIAGNOSIS — F330 Major depressive disorder, recurrent, mild: Secondary | ICD-10-CM | POA: Diagnosis not present

## 2019-07-19 NOTE — Telephone Encounter (Signed)
Patient called and said she wants to know if elaysha knows a stomach specialist who deals with ssri meds and how they affect people.? Please give her a call at [phone]

## 2019-07-25 NOTE — Telephone Encounter (Signed)
Patient given information and agreed to checking with him due to her stomach issues after starting her psych medications.

## 2019-07-26 DIAGNOSIS — F330 Major depressive disorder, recurrent, mild: Secondary | ICD-10-CM | POA: Diagnosis not present

## 2019-08-02 DIAGNOSIS — F330 Major depressive disorder, recurrent, mild: Secondary | ICD-10-CM | POA: Diagnosis not present

## 2019-08-09 DIAGNOSIS — F419 Anxiety disorder, unspecified: Secondary | ICD-10-CM | POA: Diagnosis not present

## 2019-08-18 DIAGNOSIS — K580 Irritable bowel syndrome with diarrhea: Secondary | ICD-10-CM | POA: Diagnosis not present

## 2019-08-18 DIAGNOSIS — R1030 Lower abdominal pain, unspecified: Secondary | ICD-10-CM | POA: Diagnosis not present

## 2019-08-18 DIAGNOSIS — R197 Diarrhea, unspecified: Secondary | ICD-10-CM | POA: Diagnosis not present

## 2019-08-23 DIAGNOSIS — F330 Major depressive disorder, recurrent, mild: Secondary | ICD-10-CM | POA: Diagnosis not present

## 2019-08-30 DIAGNOSIS — F330 Major depressive disorder, recurrent, mild: Secondary | ICD-10-CM | POA: Diagnosis not present

## 2019-09-13 DIAGNOSIS — F330 Major depressive disorder, recurrent, mild: Secondary | ICD-10-CM | POA: Diagnosis not present

## 2019-09-20 DIAGNOSIS — F330 Major depressive disorder, recurrent, mild: Secondary | ICD-10-CM | POA: Diagnosis not present

## 2019-09-27 DIAGNOSIS — F330 Major depressive disorder, recurrent, mild: Secondary | ICD-10-CM | POA: Diagnosis not present

## 2019-10-04 DIAGNOSIS — F330 Major depressive disorder, recurrent, mild: Secondary | ICD-10-CM | POA: Diagnosis not present

## 2019-10-20 DIAGNOSIS — F330 Major depressive disorder, recurrent, mild: Secondary | ICD-10-CM | POA: Diagnosis not present

## 2019-10-21 ENCOUNTER — Other Ambulatory Visit: Payer: Self-pay

## 2019-10-21 DIAGNOSIS — F411 Generalized anxiety disorder: Secondary | ICD-10-CM

## 2019-10-21 MED ORDER — BUSPIRONE HCL 30 MG PO TABS: ORAL_TABLET | ORAL | 0 refills | Status: DC

## 2019-11-01 DIAGNOSIS — N926 Irregular menstruation, unspecified: Secondary | ICD-10-CM | POA: Diagnosis not present

## 2019-11-10 DIAGNOSIS — F330 Major depressive disorder, recurrent, mild: Secondary | ICD-10-CM | POA: Diagnosis not present

## 2019-12-01 DIAGNOSIS — F330 Major depressive disorder, recurrent, mild: Secondary | ICD-10-CM | POA: Diagnosis not present

## 2019-12-15 DIAGNOSIS — F330 Major depressive disorder, recurrent, mild: Secondary | ICD-10-CM | POA: Diagnosis not present

## 2019-12-28 DIAGNOSIS — Z6832 Body mass index (BMI) 32.0-32.9, adult: Secondary | ICD-10-CM | POA: Diagnosis not present

## 2019-12-28 DIAGNOSIS — Z01419 Encounter for gynecological examination (general) (routine) without abnormal findings: Secondary | ICD-10-CM | POA: Diagnosis not present

## 2019-12-28 DIAGNOSIS — Z1231 Encounter for screening mammogram for malignant neoplasm of breast: Secondary | ICD-10-CM | POA: Diagnosis not present

## 2019-12-29 DIAGNOSIS — F330 Major depressive disorder, recurrent, mild: Secondary | ICD-10-CM | POA: Diagnosis not present

## 2020-01-05 DIAGNOSIS — F330 Major depressive disorder, recurrent, mild: Secondary | ICD-10-CM | POA: Diagnosis not present

## 2020-01-06 ENCOUNTER — Other Ambulatory Visit: Payer: Self-pay

## 2020-01-06 ENCOUNTER — Ambulatory Visit (INDEPENDENT_AMBULATORY_CARE_PROVIDER_SITE_OTHER): Payer: BC Managed Care – PPO | Admitting: Psychiatry

## 2020-01-06 ENCOUNTER — Encounter: Payer: Self-pay | Admitting: Psychiatry

## 2020-01-06 DIAGNOSIS — F3281 Premenstrual dysphoric disorder: Secondary | ICD-10-CM

## 2020-01-06 DIAGNOSIS — F410 Panic disorder [episodic paroxysmal anxiety] without agoraphobia: Secondary | ICD-10-CM

## 2020-01-06 DIAGNOSIS — F411 Generalized anxiety disorder: Secondary | ICD-10-CM

## 2020-01-06 MED ORDER — BUSPIRONE HCL 30 MG PO TABS: ORAL_TABLET | ORAL | 3 refills | Status: DC

## 2020-01-06 MED ORDER — PAROXETINE HCL 20 MG PO TABS: 20.0000 mg | ORAL_TABLET | Freq: Every day | ORAL | 3 refills | Status: DC

## 2020-01-06 MED ORDER — LORAZEPAM 1 MG PO TABS: 1.0000 mg | ORAL_TABLET | Freq: Four times a day (QID) | ORAL | 2 refills | Status: DC | PRN

## 2020-01-06 NOTE — Progress Notes (Signed)
Beth Reid [id number] 06/18/1975 44 y.o.  Subjective:   Patient ID:  Beth Reid is a 45 y.o. (DOB 1975-12-27) female.  Chief Complaint:  Chief Complaint  Patient presents with  . Follow-up    Anxiety, Depression, and insomnia    HPI Beth Reid presents to the office today for follow-up of anxiety, depression, and sleep disturbance. She reports that her children have returned to school. She started  Exercising exercising and this causes her to feel better overall and then started having increased menstrual bleeding q 2 weeks. She reports that she saw a gynecologist and exam was WNL and plans to resume exercise. She reports that she feels that she is now better able to manage her children and not feel as stressed. "I personally feel overall fine." Notices occasional depression in response to triggers with extended family and will have lower energy and motivation and will spend a few days on the couch and may sleep excessively. She is now identifying triggers and feels that she is handling triggers better. She reports occasional moments of panic in response to severe panic. She reports that she typically does not have full blown panic attacks. She reports that her energy is ok when she is not being triggered. Continues to notice changes in mood and anxiety with increased anger around menses. Sleeping ok. She reports that she feels more in control of her appetite now and notices increase in appetite soon after she takes her medication. She reports that she is no longer overeating. Concentration has been ok. She reports that her energy was improved with exercise and feels more tired now. She reports occasional passive death wishes in response to triggers. Denies SI.   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 Working to set boundaries with extended family. Son is going to see a play therapist for anger issues.    Reports taking Ativan prn about once a month.   Reports that she stopped eating  dairy and caffeine.   Past Psychiatric Medication Trials: Paxil- Severe sexual side effects at 30 mg. Zoloft Lexapro BuSpar Xanax Ativan Risperidone  PHQ2-9     Office Visit from 06/15/2017 in [facility] Office Visit from 06/04/2017 in [facility] Office Visit from 05/06/2017 in [facility] Office Visit from 09/25/2016 in [facility]  PHQ-2 Total Score 0 0 1 0  PHQ-9 Total Score -- -- 1 0       Review of Systems:  Review of Systems  Gastrointestinal:       Occ IBS s/s  Genitourinary: Positive for menstrual problem.    Medications: I have reviewed the patient's current medications.  Current Outpatient Medications  Medication Sig Dispense Refill  . Bacillus Coagulans-Inulin (ALIGN PREBIOTIC-PROBIOTIC PO) Take by mouth.    . busPIRone (BUSPAR) 30 MG tablet TAKE 1 TABLET(30 MG) BY MOUTH TWICE DAILY 180 tablet 3  . PARoxetine (PAXIL) 20 MG tablet Take 1 tablet (20 mg total) by mouth daily. 90 tablet 3  . LORazepam (ATIVAN) 1 MG tablet Take 1 tablet (1 mg total) by mouth every 6 (six) hours as needed for anxiety. 30 tablet 2   No current facility-administered medications for this visit.    Medication Side Effects: Other: wt gain, sexual side effects  Allergies:  Allergies  Allergen Reactions  . Zoloft  [Sertraline Hcl] Itching    Burning of the skin  . Estrogens Anxiety    Panic Attacks  . Epinephrine   .  Lexapro  [Escitalopram Oxalate] Nausea Only    Past Medical History:  Diagnosis Date  . Allergy   . Anxiety   . Depression   . Medical history non-contributory     Family History  Problem Relation Age of Onset  . Diabetes Mother   . Heart disease Mother   . Cancer Mother        breast cancer  . Glaucoma Mother   . Mood Disorder Paternal Grandmother   . Anxiety disorder Brother   . Anxiety disorder  Daughter   . Anxiety disorder Sister   . Depression Sister   . Anxiety disorder Son     Social History   Socioeconomic History  . Marital status: Married    Spouse name: Not on file  . Number of children: Not on file  . Years of education: Not on file  . Highest education level: Not on file  Occupational History  . Not on file  Tobacco Use  . Smoking status: Never Smoker  . Smokeless tobacco: Never Used  Substance and Sexual Activity  . Alcohol use: No  . Drug use: No  . Sexual activity: Yes  Other Topics Concern  . Not on file  Social History Narrative  . Not on file   Social Determinants of Health   Financial Resource Strain:   . Difficulty of Paying Living Expenses: Not on file  Food Insecurity:   . Worried About Programme researcher, broadcasting/film/video in the Last Year: Not on file  . Ran Out of Food in the Last Year: Not on file  Transportation Needs:   . Lack of Transportation (Medical): Not on file  . Lack of Transportation (Non-Medical): Not on file  Physical Activity:   . Days of Exercise per Week: Not on file  . Minutes of Exercise per Session: Not on file  Stress:   . Feeling of Stress : Not on file  Social Connections:   . Frequency of Communication with Friends and Family: Not on file  . Frequency of Social Gatherings with Friends and Family: Not on file  . Attends Religious Services: Not on file  . Active Member of Clubs or Organizations: Not on file  . Attends Banker Meetings: Not on file  . Marital Status: Not on file  Intimate Partner Violence:   . Fear of Current or Ex-Partner: Not on file  . Emotionally Abused: Not on file  . Physically Abused: Not on file  . Sexually Abused: Not on file    Past Medical History, Surgical history, Social history, and Family history were reviewed and updated as appropriate.   Please see review of systems for further details on the patient's review from today.   Objective:   Physical Exam:  There were no vitals  taken for this visit.  Physical Exam Constitutional:      General: She is not in acute distress. Musculoskeletal:        General: No deformity.  Neurological:     Mental Status: She is alert and oriented to person, place, and time.     Coordination: Coordination normal.  Psychiatric:        Attention and Perception: Attention and perception normal. She does not perceive auditory or visual hallucinations.        Mood and Affect: Mood normal. Mood is not anxious or depressed. Affect is not labile, blunt, angry or inappropriate.        Speech: Speech normal.  Behavior: Behavior normal.        Thought Content: Thought content normal. Thought content is not paranoid or delusional. Thought content does not include homicidal or suicidal ideation. Thought content does not include homicidal or suicidal plan.        Cognition and Memory: Cognition and memory normal.        Judgment: Judgment normal.     Comments: Insight intact     Lab Review:     Component Value Date/Time   NA 141 05/06/2017 1040   K 4.9 05/06/2017 1040   CL 105 05/06/2017 1040   CO2 29 05/06/2017 1040   GLUCOSE 106 (H) 05/06/2017 1040   BUN 8 05/06/2017 1040   CREATININE 0.79 05/06/2017 1040   CALCIUM 9.9 05/06/2017 1040   PROT 7.4 05/06/2017 1040   ALBUMIN 4.5 05/06/2017 1040   AST 17 05/06/2017 1040   ALT 14 05/06/2017 1040   ALKPHOS 77 05/06/2017 1040   BILITOT 0.4 05/06/2017 1040       Component Value Date/Time   WBC 9.0 05/06/2017 1040   RBC 4.78 05/06/2017 1040   HGB 14.0 05/06/2017 1040   HCT 42.9 05/06/2017 1040   PLT 289.0 05/06/2017 1040   MCV 89.7 05/06/2017 1040   MCH 28.2 12/19/2012 0700   MCHC 32.8 05/06/2017 1040   RDW 13.7 05/06/2017 1040   LYMPHSABS 1.9 05/06/2017 1040   MONOABS 0.6 05/06/2017 1040   EOSABS 0.1 05/06/2017 1040   BASOSABS 0.0 05/06/2017 1040    No results found for: POCLITH, LITHIUM   No results found for: PHENYTOIN, PHENOBARB, VALPROATE, CBMZ    .res Assessment: Plan:   Will continue current plan of care since target signs and symptoms are well controlled. Continue Paxil 20 mg po qd for mood and anxiety. Continue Buspar 30 mg twice daily for anxiety.  Continue Ativan 1 mg as needed for anxiety.  Pt to follow-up in one year or sooner if clinically indicated.  Recommend continuing therapy.  Patient advised to contact office with any questions, adverse effects, or acute worsening in signs and symptoms.   Beth Reid was seen today for follow-up.  Diagnoses and all orders for this visit:  Panic disorder -     PARoxetine (PAXIL) 20 MG tablet; Take 1 tablet (20 mg total) by mouth daily. -     LORazepam (ATIVAN) 1 MG tablet; Take 1 tablet (1 mg total) by mouth every 6 (six) hours as needed for anxiety.  Premenstrual dysphoric disorder -     PARoxetine (PAXIL) 20 MG tablet; Take 1 tablet (20 mg total) by mouth daily. -     LORazepam (ATIVAN) 1 MG tablet; Take 1 tablet (1 mg total) by mouth every 6 (six) hours as needed for anxiety.  Generalized anxiety disorder -     busPIRone (BUSPAR) 30 MG tablet; TAKE 1 TABLET(30 MG) BY MOUTH TWICE DAILY -     LORazepam (ATIVAN) 1 MG tablet; Take 1 tablet (1 mg total) by mouth every 6 (six) hours as needed for anxiety.     Please see After Visit Summary for patient specific instructions.  Future Appointments  Date Time Provider Department Center  01/04/2021  9:00 AM Corie Chiquito, PMHNP [facility] None    No orders of the defined types were placed in this encounter.   -------------------------------

## 2020-01-12 DIAGNOSIS — F330 Major depressive disorder, recurrent, mild: Secondary | ICD-10-CM | POA: Diagnosis not present

## 2020-01-19 DIAGNOSIS — F330 Major depressive disorder, recurrent, mild: Secondary | ICD-10-CM | POA: Diagnosis not present

## 2020-01-26 DIAGNOSIS — F330 Major depressive disorder, recurrent, mild: Secondary | ICD-10-CM | POA: Diagnosis not present

## 2020-02-03 DIAGNOSIS — F330 Major depressive disorder, recurrent, mild: Secondary | ICD-10-CM | POA: Diagnosis not present

## 2020-02-09 DIAGNOSIS — F330 Major depressive disorder, recurrent, mild: Secondary | ICD-10-CM | POA: Diagnosis not present

## 2020-02-16 DIAGNOSIS — F330 Major depressive disorder, recurrent, mild: Secondary | ICD-10-CM | POA: Diagnosis not present

## 2020-02-23 DIAGNOSIS — F330 Major depressive disorder, recurrent, mild: Secondary | ICD-10-CM | POA: Diagnosis not present

## 2020-03-01 DIAGNOSIS — F330 Major depressive disorder, recurrent, mild: Secondary | ICD-10-CM | POA: Diagnosis not present

## 2020-03-08 DIAGNOSIS — F330 Major depressive disorder, recurrent, mild: Secondary | ICD-10-CM | POA: Diagnosis not present

## 2020-03-22 DIAGNOSIS — F330 Major depressive disorder, recurrent, mild: Secondary | ICD-10-CM | POA: Diagnosis not present

## 2020-03-27 ENCOUNTER — Telehealth: Payer: Self-pay | Admitting: Psychiatry

## 2020-03-27 DIAGNOSIS — R1030 Lower abdominal pain, unspecified: Secondary | ICD-10-CM | POA: Diagnosis not present

## 2020-03-27 DIAGNOSIS — K580 Irritable bowel syndrome with diarrhea: Secondary | ICD-10-CM | POA: Diagnosis not present

## 2020-03-27 DIAGNOSIS — R197 Diarrhea, unspecified: Secondary | ICD-10-CM | POA: Diagnosis not present

## 2020-03-27 NOTE — Telephone Encounter (Signed)
Please review

## 2020-03-27 NOTE — Telephone Encounter (Signed)
Beth Reid called to report that she is still having stomach issues with the Paxil. She has gone to PCP several times for these issues.  She wants to know if perhaps there is another medication that will work but not be hard on her stomach.  Please advise.

## 2020-03-29 DIAGNOSIS — F330 Major depressive disorder, recurrent, mild: Secondary | ICD-10-CM | POA: Diagnosis not present

## 2020-03-29 DIAGNOSIS — R197 Diarrhea, unspecified: Secondary | ICD-10-CM | POA: Diagnosis not present

## 2020-03-30 NOTE — Telephone Encounter (Signed)
Tried to reach patient but mail box full. Will try next week

## 2020-04-05 DIAGNOSIS — F330 Major depressive disorder, recurrent, mild: Secondary | ICD-10-CM | POA: Diagnosis not present

## 2020-04-19 DIAGNOSIS — F330 Major depressive disorder, recurrent, mild: Secondary | ICD-10-CM | POA: Diagnosis not present

## 2020-04-26 DIAGNOSIS — F330 Major depressive disorder, recurrent, mild: Secondary | ICD-10-CM | POA: Diagnosis not present

## 2020-04-30 DIAGNOSIS — R509 Fever, unspecified: Secondary | ICD-10-CM | POA: Diagnosis not present

## 2020-04-30 DIAGNOSIS — R5383 Other fatigue: Secondary | ICD-10-CM | POA: Diagnosis not present

## 2020-04-30 DIAGNOSIS — R062 Wheezing: Secondary | ICD-10-CM | POA: Diagnosis not present

## 2020-04-30 DIAGNOSIS — R0602 Shortness of breath: Secondary | ICD-10-CM | POA: Diagnosis not present

## 2020-05-24 DIAGNOSIS — F330 Major depressive disorder, recurrent, mild: Secondary | ICD-10-CM | POA: Diagnosis not present

## 2020-05-31 DIAGNOSIS — F330 Major depressive disorder, recurrent, mild: Secondary | ICD-10-CM | POA: Diagnosis not present

## 2020-06-07 DIAGNOSIS — F330 Major depressive disorder, recurrent, mild: Secondary | ICD-10-CM | POA: Diagnosis not present

## 2020-06-14 DIAGNOSIS — F330 Major depressive disorder, recurrent, mild: Secondary | ICD-10-CM | POA: Diagnosis not present

## 2020-07-05 DIAGNOSIS — F330 Major depressive disorder, recurrent, mild: Secondary | ICD-10-CM | POA: Diagnosis not present

## 2020-07-12 DIAGNOSIS — F330 Major depressive disorder, recurrent, mild: Secondary | ICD-10-CM | POA: Diagnosis not present

## 2020-07-19 DIAGNOSIS — F330 Major depressive disorder, recurrent, mild: Secondary | ICD-10-CM | POA: Diagnosis not present

## 2020-07-26 DIAGNOSIS — F330 Major depressive disorder, recurrent, mild: Secondary | ICD-10-CM | POA: Diagnosis not present

## 2020-08-02 DIAGNOSIS — F330 Major depressive disorder, recurrent, mild: Secondary | ICD-10-CM | POA: Diagnosis not present

## 2020-08-09 DIAGNOSIS — F330 Major depressive disorder, recurrent, mild: Secondary | ICD-10-CM | POA: Diagnosis not present

## 2020-08-16 DIAGNOSIS — F330 Major depressive disorder, recurrent, mild: Secondary | ICD-10-CM | POA: Diagnosis not present

## 2020-08-23 DIAGNOSIS — F330 Major depressive disorder, recurrent, mild: Secondary | ICD-10-CM | POA: Diagnosis not present

## 2020-08-30 DIAGNOSIS — F330 Major depressive disorder, recurrent, mild: Secondary | ICD-10-CM | POA: Diagnosis not present

## 2020-09-04 DIAGNOSIS — F330 Major depressive disorder, recurrent, mild: Secondary | ICD-10-CM | POA: Diagnosis not present

## 2020-09-13 DIAGNOSIS — F330 Major depressive disorder, recurrent, mild: Secondary | ICD-10-CM | POA: Diagnosis not present

## 2020-09-20 DIAGNOSIS — F330 Major depressive disorder, recurrent, mild: Secondary | ICD-10-CM | POA: Diagnosis not present

## 2020-09-27 DIAGNOSIS — F330 Major depressive disorder, recurrent, mild: Secondary | ICD-10-CM | POA: Diagnosis not present

## 2020-10-18 DIAGNOSIS — F330 Major depressive disorder, recurrent, mild: Secondary | ICD-10-CM | POA: Diagnosis not present

## 2020-11-02 ENCOUNTER — Telehealth: Payer: Self-pay | Admitting: Psychiatry

## 2020-11-02 NOTE — Telephone Encounter (Signed)
Aliani called because she doesn't feel her medications are working as well as they used to. She feels perhaps she should add an antidepressant or something.  She is medication sensitive so she is not sure what can be done but would like discuss what is going on and discuss options.  Appt 12/05/20 and is on wait list.  Please call.

## 2020-11-02 NOTE — Telephone Encounter (Signed)
Gracin stated she has some increased depression she is not sure if her PMDD is causing it,or if she needs meds adjusted.She stated she is very sad and feels like she can't get over things.She also says she is having hot flashes that she thinks is coming from her meds.She is on cancellation list

## 2020-11-02 NOTE — Telephone Encounter (Signed)
She is on the cancel list too.

## 2020-11-22 DIAGNOSIS — F330 Major depressive disorder, recurrent, mild: Secondary | ICD-10-CM | POA: Diagnosis not present

## 2020-11-29 DIAGNOSIS — F330 Major depressive disorder, recurrent, mild: Secondary | ICD-10-CM | POA: Diagnosis not present

## 2020-12-05 ENCOUNTER — Encounter: Payer: Self-pay | Admitting: Psychiatry

## 2020-12-05 ENCOUNTER — Ambulatory Visit (INDEPENDENT_AMBULATORY_CARE_PROVIDER_SITE_OTHER): Payer: BC Managed Care – PPO | Admitting: Psychiatry

## 2020-12-05 ENCOUNTER — Other Ambulatory Visit: Payer: Self-pay

## 2020-12-05 DIAGNOSIS — F3281 Premenstrual dysphoric disorder: Secondary | ICD-10-CM

## 2020-12-05 DIAGNOSIS — F410 Panic disorder [episodic paroxysmal anxiety] without agoraphobia: Secondary | ICD-10-CM

## 2020-12-05 DIAGNOSIS — F411 Generalized anxiety disorder: Secondary | ICD-10-CM | POA: Diagnosis not present

## 2020-12-05 MED ORDER — BUSPIRONE HCL 30 MG PO TABS: ORAL_TABLET | ORAL | 3 refills | Status: DC

## 2020-12-05 MED ORDER — LORAZEPAM 1 MG PO TABS: 1.0000 mg | ORAL_TABLET | Freq: Four times a day (QID) | ORAL | 0 refills | Status: DC | PRN

## 2020-12-05 MED ORDER — LAMOTRIGINE 100 MG PO TABS: ORAL_TABLET | ORAL | 0 refills | Status: DC

## 2020-12-05 MED ORDER — PAROXETINE HCL 20 MG PO TABS: 20.0000 mg | ORAL_TABLET | Freq: Every day | ORAL | 3 refills | Status: DC

## 2020-12-05 MED ORDER — LAMOTRIGINE 25 MG PO TABS: ORAL_TABLET | ORAL | 0 refills | Status: DC

## 2020-12-05 NOTE — Progress Notes (Signed)
Beth Reid [id number] [birth year] 45 y.o.  Subjective:   Patient ID:  Beth Reid is a 45 y.o. (DOB [birth year]) female.  Chief Complaint:  Chief Complaint  Patient presents with   Anxiety   Other    PMDD    HPI Beth Reid presents to the office today for follow-up of PMDD, depression, and anxiety. She reports that she currently has her period "and this round has been ok."  She has been using an app to track her menstrual cycle. She reports that she went 50 days without menstrual cycle. She reports that over the summer she noticed more intense PMDD s/s than usual to include suicidal thoughts. She reports that typically s/s occur about a week prior to the onset of menses and usually resolve after menses start. She reports that she has severe fatigue around menses and also has some GI s/s related to menstrual changes.   She reports that her motivation has been lower on medication. She reports that energy is low except for the week after menses when she also has improved motivation. "I feel like I can't get over the sadness of my family" and this is exacerbated by holidays.   She reports chronic rumination about family issues for several years. She reports that she will "re-live trauma" at times. She reports that she has increased anxiety around menses and unable to make decisions. She reports that she has panic s/s around menses. She reports panic attacks in response to stressful situations. Sleeping ok. She reports that she is frequently hungry and appetite is high. She reports increased hunger after taking medication. Denies SI.   She reports that she continues to have some family stressors and sees therapist regularly.   She reports that she is now making friends. Their house has been renovated. Children are now 45 yo and 43 yo.   Past Psychiatric Medication Trials: Paxil- Severe sexual side effects at 30 mg.  Zoloft Lexapro BuSpar Xanax Ativan Risperidone  PHQ2-9     Flowsheet Row Office Visit from 06/15/2017 in [facility] Office Visit from 06/04/2017 in [facility] Office Visit from 05/06/2017 in [facility] Office Visit from 09/25/2016 in [facility]  PHQ-2 Total Score 0 0 1 0  PHQ-9 Total Score -- -- 1 0        Review of Systems:  Review of Systems  Constitutional:  Positive for diaphoresis.  Gastrointestinal:        She reports that GI s/s have improved with avoiding lactose  Endocrine: Positive for heat intolerance.  Genitourinary:        Menstrual changes  Musculoskeletal:  Negative for gait problem.  Psychiatric/Behavioral:         Please refer to HPI   Medications: I have reviewed the patient's current medications.  Current Outpatient Medications  Medication Sig Dispense Refill   lamoTRIgine (LAMICTAL) 100 MG tablet Take 1 tab po qd x 2 weeks, then increase to 1.5 tabs po qd 45 tablet 0   lamoTRIgine (LAMICTAL) 25 MG tablet Take 1 tablet (25 mg total) by mouth daily for 14 days, THEN 2 tablets (50 mg total) daily for 14 days. 45 tablet 0   busPIRone (BUSPAR) 30 MG tablet TAKE 1 TABLET(30 MG) BY MOUTH TWICE DAILY 180 tablet 3   LORazepam (ATIVAN) 1 MG tablet Take 1 tablet (1 mg total) by mouth every 6 (six) hours as needed for anxiety. 30 tablet 0  PARoxetine (PAXIL) 20 MG tablet Take 1 tablet (20 mg total) by mouth daily. 90 tablet 3   No current facility-administered medications for this visit.    Medication Side Effects: Other: Possible heath intolerance and increased sweats  Wt gain  Allergies:  Allergies  Allergen Reactions   Zoloft  [Sertraline Hcl] Itching    Burning of the skin   Estrogens Anxiety    Panic Attacks   Epinephrine    Lexapro  [Escitalopram Oxalate] Nausea Only    Past Medical History:  Diagnosis Date   Allergy    Anxiety    Depression     Medical history non-contributory     Past Medical History, Surgical history, Social history, and Family history were reviewed and updated as appropriate.   Please see review of systems for further details on the patient's review from today.   Objective:   Physical Exam:  There were no vitals taken for this visit.  Physical Exam Constitutional:      General: She is not in acute distress. Musculoskeletal:        General: No deformity.  Neurological:     Mental Status: She is alert and oriented to person, place, and time.     Coordination: Coordination normal.  Psychiatric:        Attention and Perception: Attention and perception normal. She does not perceive auditory or visual hallucinations.        Mood and Affect: Mood is anxious. Mood is not depressed. Affect is not labile, blunt, angry or inappropriate.        Speech: Speech normal.        Behavior: Behavior normal.        Thought Content: Thought content normal. Thought content is not paranoid or delusional. Thought content does not include homicidal or suicidal ideation. Thought content does not include homicidal or suicidal plan.        Cognition and Memory: Cognition and memory normal.        Judgment: Judgment normal.     Comments: Insight intact    Lab Review:     Component Value Date/Time   NA 141 05/06/2017 1040   K 4.9 05/06/2017 1040   CL 105 05/06/2017 1040   CO2 29 05/06/2017 1040   GLUCOSE 106 (H) 05/06/2017 1040   BUN 8 05/06/2017 1040   CREATININE 0.79 05/06/2017 1040   CALCIUM 9.9 05/06/2017 1040   PROT 7.4 05/06/2017 1040   ALBUMIN 4.5 05/06/2017 1040   AST 17 05/06/2017 1040   ALT 14 05/06/2017 1040   ALKPHOS 77 05/06/2017 1040   BILITOT 0.4 05/06/2017 1040       Component Value Date/Time   WBC 9.0 05/06/2017 1040   RBC 4.78 05/06/2017 1040   HGB 14.0 05/06/2017 1040   HCT 42.9 05/06/2017 1040   PLT 289.0 05/06/2017 1040   MCV 89.7 05/06/2017 1040   MCH 28.2 12/19/2012 0700   MCHC 32.8  05/06/2017 1040   RDW 13.7 05/06/2017 1040   LYMPHSABS 1.9 05/06/2017 1040   MONOABS 0.6 05/06/2017 1040   EOSABS 0.1 05/06/2017 1040   BASOSABS 0.0 05/06/2017 1040    No results found for: POCLITH, LITHIUM   No results found for: PHENYTOIN, PHENOBARB, VALPROATE, CBMZ   .res Assessment: Plan:   Pt seen for 30 minutes and time spent discussing treatment options for anxiety and PMDD. Counseled patient regarding potential benefits, risks, and side effects of Lamictal to include potential risk of Stevens-Johnson syndrome. Advised patient to stop  taking Lamictal and contact office immediately if rash develops and to seek urgent medical attention if rash is severe and/or spreading quickly. Discussed that Lamictal is indicated for Bipolar depression and is used off label for depression without Bipolar D/O and PMDD. Discussed that Lamictal may be helpful for mood lability duWill start Lamictal 25 mg daily for 2 weeks, then increase to 50 mg daily for 2 weeks, then 100 mg daily for 2 weeks, then 150 mg daily for mood symptoms. She agrees to trial of Lamictal.  Continue Paxil 20 mg po qd for anxiety and depression. May consider Viibryd in the future due to lower risk of wt gain and sexual side effects.  Continue Ativan 1 mg po q 6 hours prn anxiety. Continue Buspar 30 mg po BID for anxiety.  Pt to follow-up in 8 weeks or sooner if clinically indicated.  Patient advised to contact office with any questions, adverse effects, or acute worsening in signs and symptoms.   Beth Reid was seen today for anxiety and other.  Diagnoses and all orders for this visit:  Premenstrual dysphoric disorder -     lamoTRIgine (LAMICTAL) 25 MG tablet; Take 1 tablet (25 mg total) by mouth daily for 14 days, THEN 2 tablets (50 mg total) daily for 14 days. -     lamoTRIgine (LAMICTAL) 100 MG tablet; Take 1 tab po qd x 2 weeks, then increase to 1.5 tabs po qd -     PARoxetine (PAXIL) 20 MG tablet; Take 1 tablet (20 mg total)  by mouth daily. -     LORazepam (ATIVAN) 1 MG tablet; Take 1 tablet (1 mg total) by mouth every 6 (six) hours as needed for anxiety.  Generalized anxiety disorder -     busPIRone (BUSPAR) 30 MG tablet; TAKE 1 TABLET(30 MG) BY MOUTH TWICE DAILY -     LORazepam (ATIVAN) 1 MG tablet; Take 1 tablet (1 mg total) by mouth every 6 (six) hours as needed for anxiety.  Panic disorder -     PARoxetine (PAXIL) 20 MG tablet; Take 1 tablet (20 mg total) by mouth daily. -     LORazepam (ATIVAN) 1 MG tablet; Take 1 tablet (1 mg total) by mouth every 6 (six) hours as needed for anxiety.    Please see After Visit Summary for patient specific instructions.  Future Appointments  Date Time Provider Department Center  01/04/2021  9:00 AM Corie Chiquito, PMHNP [facility] None  01/28/2021  9:00 AM Corie Chiquito, PMHNP [facility] None    No orders of the defined types were placed in this encounter.   -------------------------------

## 2020-12-06 DIAGNOSIS — F330 Major depressive disorder, recurrent, mild: Secondary | ICD-10-CM | POA: Diagnosis not present

## 2020-12-20 DIAGNOSIS — F330 Major depressive disorder, recurrent, mild: Secondary | ICD-10-CM | POA: Diagnosis not present

## 2020-12-27 DIAGNOSIS — F330 Major depressive disorder, recurrent, mild: Secondary | ICD-10-CM | POA: Diagnosis not present

## 2021-01-03 DIAGNOSIS — F330 Major depressive disorder, recurrent, mild: Secondary | ICD-10-CM | POA: Diagnosis not present

## 2021-01-04 ENCOUNTER — Ambulatory Visit: Payer: BC Managed Care – PPO | Admitting: Psychiatry

## 2021-01-10 DIAGNOSIS — F330 Major depressive disorder, recurrent, mild: Secondary | ICD-10-CM | POA: Diagnosis not present

## 2021-01-17 DIAGNOSIS — F330 Major depressive disorder, recurrent, mild: Secondary | ICD-10-CM | POA: Diagnosis not present

## 2021-01-21 ENCOUNTER — Telehealth: Payer: Self-pay | Admitting: Psychiatry

## 2021-01-21 NOTE — Telephone Encounter (Signed)
Noted  

## 2021-01-21 NOTE — Telephone Encounter (Signed)
Pt LVM cancelling appt on 10/10.  She stated that she has not been able to start using new meds prescribed to her due to her daughter having anxiety about school.  She doesn't want to start taking new meds until her daughter is more settled with school.

## 2021-01-28 ENCOUNTER — Ambulatory Visit: Payer: BC Managed Care – PPO | Admitting: Psychiatry

## 2021-01-31 ENCOUNTER — Other Ambulatory Visit: Payer: Self-pay

## 2021-01-31 ENCOUNTER — Ambulatory Visit (INDEPENDENT_AMBULATORY_CARE_PROVIDER_SITE_OTHER): Payer: BC Managed Care – PPO | Admitting: Podiatry

## 2021-01-31 ENCOUNTER — Ambulatory Visit (INDEPENDENT_AMBULATORY_CARE_PROVIDER_SITE_OTHER): Payer: BC Managed Care – PPO

## 2021-01-31 DIAGNOSIS — B070 Plantar wart: Secondary | ICD-10-CM

## 2021-01-31 DIAGNOSIS — S90122A Contusion of left lesser toe(s) without damage to nail, initial encounter: Secondary | ICD-10-CM

## 2021-01-31 DIAGNOSIS — F330 Major depressive disorder, recurrent, mild: Secondary | ICD-10-CM | POA: Diagnosis not present

## 2021-01-31 MED ORDER — CIMETIDINE 400 MG PO TABS: 400.0000 mg | ORAL_TABLET | Freq: Two times a day (BID) | ORAL | 0 refills | Status: DC

## 2021-02-05 NOTE — Progress Notes (Signed)
  Subjective:  Patient ID: Beth Reid, female    DOB: Aug 29, 1975,  MRN: [id number]  Chief Complaint  Patient presents with   Plantar Warts    (np) bil wart pain/left foot pinky toe pain    45 y.o. female presents with the above complaint. History confirmed with patient.  Has multiple issues including multiple warts on both feet that can be painful as well as an injury that she suffered in June 22 to the pinky toe stubbed it and has not been doing much better continues to be painful  Objective:  Physical Exam: warm, good capillary refill, no trophic changes or ulcerative lesions, normal DP and PT pulses, and normal sensory exam. Left Foot: Difficult to reproduce the pain and the fifth toe or in the fourth interspace.  Intermittent and inconsistent.  She has multiple plantar verruca on both feet Radiographs: Multiple views x-ray of the left foot: no fracture, dislocation, swelling or degenerative changes noted Assessment:   1. Verruca plantaris   2. Contusion of lesser toe of left foot without damage to nail, initial encounter      Plan:  Patient was evaluated and treated and all questions answered.  Is unclear to me what is causing the pain on the left fifth toe just could be possible neuritis from previous blunt injury.  May need MRI to further evaluate.  We will continue to monitor this.  Discussed etiology and treatment of verruca plantaris in detail with the patient as well as multiple treatment options including blistering agents, chemotherapeutic agents, surgical excision, laser therapy and the indications and roles of the above.  Today, recommended treatment with salicylic acid as noted in procedure note below.  Follow-up in 4 weeks for reevaluation.  Tagamet prescribed as well  Procedure: Destruction of Lesion Location: Multiple bilateral foot Instrumentation: 15 blade. Technique: Debridement of lesion to petechial bleeding. Aperture pad applied around lesion. Small amount  of salicylic acid applied to the base of the lesion. Dressing: Dry, sterile, compression dressing. Disposition: Patient tolerated procedure well. Advised to leave dressing on for 6-8 hours. Thereafter patient to wash the area with soap and water and applied band-aid. Off-loading pads dispensed. Patient to return in 3 weeks for follow-up.   Return in about 4 weeks (around 02/28/2021) for to check on wart.

## 2021-02-07 DIAGNOSIS — F330 Major depressive disorder, recurrent, mild: Secondary | ICD-10-CM | POA: Diagnosis not present

## 2021-02-14 DIAGNOSIS — F330 Major depressive disorder, recurrent, mild: Secondary | ICD-10-CM | POA: Diagnosis not present

## 2021-03-05 ENCOUNTER — Ambulatory Visit: Payer: BC Managed Care – PPO | Admitting: Podiatry

## 2021-03-07 DIAGNOSIS — F330 Major depressive disorder, recurrent, mild: Secondary | ICD-10-CM | POA: Diagnosis not present

## 2021-03-13 DIAGNOSIS — R197 Diarrhea, unspecified: Secondary | ICD-10-CM | POA: Diagnosis not present

## 2021-03-21 DIAGNOSIS — F330 Major depressive disorder, recurrent, mild: Secondary | ICD-10-CM | POA: Diagnosis not present

## 2021-03-28 DIAGNOSIS — F330 Major depressive disorder, recurrent, mild: Secondary | ICD-10-CM | POA: Diagnosis not present

## 2021-04-04 DIAGNOSIS — F330 Major depressive disorder, recurrent, mild: Secondary | ICD-10-CM | POA: Diagnosis not present

## 2021-04-09 DIAGNOSIS — Z1231 Encounter for screening mammogram for malignant neoplasm of breast: Secondary | ICD-10-CM | POA: Diagnosis not present

## 2021-04-09 DIAGNOSIS — Z131 Encounter for screening for diabetes mellitus: Secondary | ICD-10-CM | POA: Diagnosis not present

## 2021-04-09 DIAGNOSIS — Z0000 Encounter for general adult medical examination without abnormal findings: Secondary | ICD-10-CM | POA: Diagnosis not present

## 2021-04-09 DIAGNOSIS — Z1329 Encounter for screening for other suspected endocrine disorder: Secondary | ICD-10-CM | POA: Diagnosis not present

## 2021-04-09 DIAGNOSIS — Z01419 Encounter for gynecological examination (general) (routine) without abnormal findings: Secondary | ICD-10-CM | POA: Diagnosis not present

## 2021-04-09 DIAGNOSIS — Z13220 Encounter for screening for lipoid disorders: Secondary | ICD-10-CM | POA: Diagnosis not present

## 2021-04-09 DIAGNOSIS — Z124 Encounter for screening for malignant neoplasm of cervix: Secondary | ICD-10-CM | POA: Diagnosis not present

## 2021-04-09 DIAGNOSIS — Z6833 Body mass index (BMI) 33.0-33.9, adult: Secondary | ICD-10-CM | POA: Diagnosis not present

## 2021-04-10 DIAGNOSIS — R7989 Other specified abnormal findings of blood chemistry: Secondary | ICD-10-CM | POA: Diagnosis not present

## 2021-04-11 DIAGNOSIS — F330 Major depressive disorder, recurrent, mild: Secondary | ICD-10-CM | POA: Diagnosis not present

## 2021-04-16 ENCOUNTER — Telehealth: Payer: Self-pay | Admitting: Podiatry

## 2021-04-16 DIAGNOSIS — S90122A Contusion of left lesser toe(s) without damage to nail, initial encounter: Secondary | ICD-10-CM

## 2021-04-16 NOTE — Telephone Encounter (Signed)
Is this the same issue that was bothering her last time I saw her on the 5th toe? If so I can order an MRI, may take a bit to get it. If it's a new issue I should see her first

## 2021-04-16 NOTE — Telephone Encounter (Signed)
Pt called stating her toe is causing her a lot of discomfort and pain. I made her an appt to see you on 12/29. However, she feels she just needs an MRI due to [place] not having the proper equipment to see what's wrong with her toe. She would like to know if you still want to see her on Thursday, or if she can move forward with having and MRI. Please advise.

## 2021-04-18 ENCOUNTER — Ambulatory Visit: Payer: BC Managed Care – PPO | Admitting: Podiatry

## 2021-04-23 NOTE — Addendum Note (Signed)
Addended byLilian Kapur, Alegra Rost R on: 04/23/2021 04:57 PM   Modules accepted: Orders

## 2021-04-25 DIAGNOSIS — F330 Major depressive disorder, recurrent, mild: Secondary | ICD-10-CM | POA: Diagnosis not present

## 2021-05-03 ENCOUNTER — Other Ambulatory Visit: Payer: Self-pay | Admitting: Obstetrics

## 2021-05-03 DIAGNOSIS — R928 Other abnormal and inconclusive findings on diagnostic imaging of breast: Secondary | ICD-10-CM

## 2021-05-06 ENCOUNTER — Ambulatory Visit: Payer: BC Managed Care – PPO | Admitting: Psychiatry

## 2021-05-07 ENCOUNTER — Ambulatory Visit: Payer: BC Managed Care – PPO

## 2021-05-07 ENCOUNTER — Encounter: Payer: Self-pay | Admitting: Psychiatry

## 2021-05-07 ENCOUNTER — Ambulatory Visit
Admission: RE | Admit: 2021-05-07 | Discharge: 2021-05-07 | Disposition: A | Discharge status: Home / Self Care | Payer: BC Managed Care – PPO | Source: Ambulatory Visit | Attending: Obstetrics | Admitting: Obstetrics

## 2021-05-07 ENCOUNTER — Ambulatory Visit (INDEPENDENT_AMBULATORY_CARE_PROVIDER_SITE_OTHER): Payer: BC Managed Care – PPO | Admitting: Psychiatry

## 2021-05-07 ENCOUNTER — Other Ambulatory Visit: Payer: Self-pay

## 2021-05-07 DIAGNOSIS — F411 Generalized anxiety disorder: Secondary | ICD-10-CM | POA: Diagnosis not present

## 2021-05-07 DIAGNOSIS — F3281 Premenstrual dysphoric disorder: Secondary | ICD-10-CM

## 2021-05-07 DIAGNOSIS — F410 Panic disorder [episodic paroxysmal anxiety] without agoraphobia: Secondary | ICD-10-CM

## 2021-05-07 DIAGNOSIS — R928 Other abnormal and inconclusive findings on diagnostic imaging of breast: Secondary | ICD-10-CM

## 2021-05-07 DIAGNOSIS — R922 Inconclusive mammogram: Secondary | ICD-10-CM | POA: Diagnosis not present

## 2021-05-07 DIAGNOSIS — N6489 Other specified disorders of breast: Secondary | ICD-10-CM | POA: Diagnosis not present

## 2021-05-07 MED ORDER — LORAZEPAM 1 MG PO TABS: 1.0000 mg | ORAL_TABLET | Freq: Four times a day (QID) | ORAL | 0 refills | Status: DC | PRN

## 2021-05-07 NOTE — Progress Notes (Signed)
Lynett Rohrer [id number] 1976/04/04 46 y.o.  Subjective:   Patient ID:  Beth Reid is a 46 y.o. (DOB Jun 27, 1975) female.  Chief Complaint:  Chief Complaint  Patient presents with   Anxiety   Other    PMDD    HPI Beth Reid presents to the office today for follow-up of anxiety, depression, and PMDD. She reports that she feels "ok." Reports that she notices a change in her mood today with the end of her menstrual cycle. She reports "10-11 good days" and then has irritability the day of ovulation. She reports depression towards the end of menses. She reports on "good days" she feels happy and has energy and is able to be productive.   She reports that she has "insatiable hunger" and notices this is within 20-30 minutes after taking Buspar. "It feels too much. I can't fight the cravings" regardless if she has eaten a meal. Se re-started weight watchers. She reports that she has IBS s/s if she does not eat certain foods or eats other foods. She reports diarrhea has been improved with eliminating oatmeal.    She reports that she feels tired all the time. She reports that she was noted to have an abnormal TSH and sees endocrinologist next week. She reports that she has had more depression in general. She reports that she has panic at times about being alone. "The depression turns into anxiety about being abandoned." She reports that she often "feels lonely." Sleeping well. She reports passive death wishes.  She reports that after last visit her daughter started having panic attacks and husband was also having severe anxiety. She reports that she decided not to change her medication in the context of stressors. Reports stress with her family of origin who lives down the street.   Uses Ativan prn anxiety.  Currently seeing Clotilde Dieter, [facility]. She reports that this has been helpful.   Past Psychiatric Medication Trials: Paxil- Severe sexual side effects at 30 mg.   Zoloft Lexapro BuSpar Xanax Ativan Risperidone  PHQ2-9    Flowsheet Row Office Visit from 06/15/2017 in [place] Office Visit from 06/04/2017 in [place] Office Visit from 05/06/2017 in [place] Office Visit from 09/25/2016 in [place]  PHQ-2 Total Score 0 0 1 0  PHQ-9 Total Score -- -- 1 0        Review of Systems:  Review of Systems  Gastrointestinal:        Chronic GI s/s  Musculoskeletal:  Negative for gait problem.  Psychiatric/Behavioral:         Please refer to HPI   Medications: I have reviewed the patient's current medications.  Current Outpatient Medications  Medication Sig Dispense Refill   busPIRone (BUSPAR) 30 MG tablet TAKE 1 TABLET(30 MG) BY MOUTH TWICE DAILY 180 tablet 3   PARoxetine (PAXIL) 20 MG tablet Take 1 tablet (20 mg total) by mouth daily. 90 tablet 3   cimetidine (TAGAMET) 400 MG tablet Take 1 tablet (400 mg total) by mouth 2 (two) times daily. 60 tablet 0   lamoTRIgine (LAMICTAL) 100 MG tablet Take 1 tab po qd x 2 weeks, then increase to 1.5 tabs po qd (Patient not taking: Reported on 05/07/2021) 45 tablet 0   lamoTRIgine (LAMICTAL) 25 MG tablet Take 1 tablet (25 mg total) by mouth daily for 14 days, THEN 2 tablets (50 mg total) daily for 14 days. 45 tablet 0   LORazepam (  ATIVAN) 1 MG tablet Take 1 tablet (1 mg total) by mouth every 6 (six) hours as needed for anxiety. 30 tablet 0   No current facility-administered medications for this visit.    Medication Side Effects: Other: Weight gain, possible GI side effects  Allergies:  Allergies  Allergen Reactions   Zoloft  [Sertraline Hcl] Itching    Burning of the skin   Estrogens Anxiety    Panic Attacks   Epinephrine    Lexapro  [Escitalopram Oxalate] Nausea Only    Past Medical History:  Diagnosis Date   Allergy    Anxiety     Depression    Medical history non-contributory     Past Medical History, Surgical history, Social history, and Family history were reviewed and updated as appropriate.   Please see review of systems for further details on the patient's review from today.   Objective:   Physical Exam:  LMP 05/03/2021   Physical Exam Constitutional:      General: She is not in acute distress. Musculoskeletal:        General: No deformity.  Neurological:     Mental Status: She is alert and oriented to person, place, and time.     Coordination: Coordination normal.  Psychiatric:        Attention and Perception: Attention and perception normal. She does not perceive auditory or visual hallucinations.        Mood and Affect: Mood is anxious. Affect is not labile, blunt, angry or inappropriate.        Speech: Speech normal.        Behavior: Behavior normal.        Thought Content: Thought content normal. Thought content is not paranoid or delusional. Thought content does not include homicidal or suicidal ideation. Thought content does not include homicidal or suicidal plan.        Cognition and Memory: Cognition and memory normal.        Judgment: Judgment normal.     Comments: Insight intact Dysphoric mood    Lab Review:     Component Value Date/Time   NA 141 05/06/2017 1040   K 4.9 05/06/2017 1040   CL 105 05/06/2017 1040   CO2 29 05/06/2017 1040   GLUCOSE 106 (H) 05/06/2017 1040   BUN 8 05/06/2017 1040   CREATININE 0.79 05/06/2017 1040   CALCIUM 9.9 05/06/2017 1040   PROT 7.4 05/06/2017 1040   ALBUMIN 4.5 05/06/2017 1040   AST 17 05/06/2017 1040   ALT 14 05/06/2017 1040   ALKPHOS 77 05/06/2017 1040   BILITOT 0.4 05/06/2017 1040       Component Value Date/Time   WBC 9.0 05/06/2017 1040   RBC 4.78 05/06/2017 1040   HGB 14.0 05/06/2017 1040   HCT 42.9 05/06/2017 1040   PLT 289.0 05/06/2017 1040   MCV 89.7 05/06/2017 1040   MCH 28.2 12/19/2012 0700   MCHC 32.8 05/06/2017 1040    RDW 13.7 05/06/2017 1040   LYMPHSABS 1.9 05/06/2017 1040   MONOABS 0.6 05/06/2017 1040   EOSABS 0.1 05/06/2017 1040   BASOSABS 0.0 05/06/2017 1040    No results found for: POCLITH, LITHIUM   No results found for: PHENYTOIN, PHENOBARB, VALPROATE, CBMZ   .res Assessment: Plan:    Pt seen for 30 minutes and time spent discussing her concerns about possible side effects with Paxil and Buspar, as well as concerns about coming off of these medications since mood and anxiety s/s were significantly worse prior to starting  these medications. Also discussed recent labs that she reports indicated abnormal thyroid levels. Agreed with plan to see endocrinologist next week to determine if she has a thyroid abnormality that could be causing or exacerbating her mood and anxiety symptoms and food cravings. Discussed following endocrinologist's recommendations regarding treatment and continuing current psychiatric medications if she is diagnosed with a thyroid disorder that requires treatment. Discussed starting Lamotrigine if no treatment is indicated by endocrinologist. Reviewed potential benefits, risks, and side effects of Lamotrigine and dosage instructions. Discussed that Lamotrigine is used off-label for depression without bipolar disorder and for PMDD and may improve depressive s/s and decrease mood lability and reactivity. Discussed that it targets glutamate and has a different mechanism of action than SSRI's, which she has had adverse reactions to in the past. Discussed that long-term goal would be to stabilize treatment with Lamotrigine and then possibly be able to reduce doses of medications and associated side effects or transition from Paxil to another medication with less side effects. Pt is in agreement with this plan. Continue Paxil 20 mg po qd for mood and anxiety s/s.  Continue Buspar 30 mg po BID for anxiety.  Continue Ativan 1 mg prn anxiety.  Pt reports that she will call to schedule  follow-up after visit with endocrinologist. Recommended follow-up in 2 months if she starts Lamotrigine.  Patient advised to contact office with any questions, adverse effects, or acute worsening in signs and symptoms.   Beth Reid was seen today for anxiety and other.  Diagnoses and all orders for this visit:  Premenstrual dysphoric disorder -     LORazepam (ATIVAN) 1 MG tablet; Take 1 tablet (1 mg total) by mouth every 6 (six) hours as needed for anxiety.  Generalized anxiety disorder -     LORazepam (ATIVAN) 1 MG tablet; Take 1 tablet (1 mg total) by mouth every 6 (six) hours as needed for anxiety.  Panic disorder -     LORazepam (ATIVAN) 1 MG tablet; Take 1 tablet (1 mg total) by mouth every 6 (six) hours as needed for anxiety.     Please see After Visit Summary for patient specific instructions.  Future Appointments  Date Time Provider [place]  05/10/2021  6:20 PM GI-[facility] MR 2 [facility]-315MRI [facility] [address]    No orders of the defined types were placed in this encounter.   -------------------------------

## 2021-05-09 DIAGNOSIS — F330 Major depressive disorder, recurrent, mild: Secondary | ICD-10-CM | POA: Diagnosis not present

## 2021-05-10 ENCOUNTER — Other Ambulatory Visit: Payer: Self-pay

## 2021-05-10 ENCOUNTER — Ambulatory Visit
Admission: RE | Admit: 2021-05-10 | Discharge: 2021-05-10 | Disposition: A | Discharge status: Home / Self Care | Payer: BC Managed Care – PPO | Source: Ambulatory Visit | Attending: Podiatry | Admitting: Podiatry

## 2021-05-10 DIAGNOSIS — R600 Localized edema: Secondary | ICD-10-CM | POA: Diagnosis not present

## 2021-05-10 DIAGNOSIS — S90122A Contusion of left lesser toe(s) without damage to nail, initial encounter: Secondary | ICD-10-CM

## 2021-05-13 DIAGNOSIS — J069 Acute upper respiratory infection, unspecified: Secondary | ICD-10-CM | POA: Diagnosis not present

## 2021-05-13 DIAGNOSIS — H6983 Other specified disorders of Eustachian tube, bilateral: Secondary | ICD-10-CM | POA: Diagnosis not present

## 2021-05-15 DIAGNOSIS — H6983 Other specified disorders of Eustachian tube, bilateral: Secondary | ICD-10-CM | POA: Diagnosis not present

## 2021-05-15 DIAGNOSIS — J029 Acute pharyngitis, unspecified: Secondary | ICD-10-CM | POA: Diagnosis not present

## 2021-05-15 DIAGNOSIS — H66003 Acute suppurative otitis media without spontaneous rupture of ear drum, bilateral: Secondary | ICD-10-CM | POA: Diagnosis not present

## 2021-05-16 ENCOUNTER — Telehealth: Payer: Self-pay | Admitting: Psychiatry

## 2021-05-16 DIAGNOSIS — F330 Major depressive disorder, recurrent, mild: Secondary | ICD-10-CM | POA: Diagnosis not present

## 2021-05-16 DIAGNOSIS — E039 Hypothyroidism, unspecified: Secondary | ICD-10-CM | POA: Diagnosis not present

## 2021-05-16 NOTE — Telephone Encounter (Signed)
Last appt was 05/07/21. On last visit Beth Reid mentioned putting her on a new medication that she wanted her to start taking. Beth Reid is not sure what the name of the medication is that Beth Reid wants her to start? Her phone number is [phone].

## 2021-05-16 NOTE — Telephone Encounter (Signed)
Medication Jeniyah wanted to know the name of was Lamictal. She said the endocrinologist wanted to know.

## 2021-05-20 ENCOUNTER — Telehealth: Payer: Self-pay | Admitting: *Deleted

## 2021-05-20 NOTE — Telephone Encounter (Signed)
Patient is calling for MRI results from 05/10/21. Please advise.

## 2021-05-23 DIAGNOSIS — F330 Major depressive disorder, recurrent, mild: Secondary | ICD-10-CM | POA: Diagnosis not present

## 2021-05-24 DIAGNOSIS — F419 Anxiety disorder, unspecified: Secondary | ICD-10-CM | POA: Diagnosis not present

## 2021-05-24 DIAGNOSIS — F3341 Major depressive disorder, recurrent, in partial remission: Secondary | ICD-10-CM | POA: Diagnosis not present

## 2021-05-24 DIAGNOSIS — F3281 Premenstrual dysphoric disorder: Secondary | ICD-10-CM | POA: Diagnosis not present

## 2021-05-24 DIAGNOSIS — E786 Lipoprotein deficiency: Secondary | ICD-10-CM | POA: Diagnosis not present

## 2021-06-03 ENCOUNTER — Telehealth: Payer: Self-pay | Admitting: Podiatry

## 2021-06-03 NOTE — Telephone Encounter (Signed)
Patient would like to know once she comes for her Cam boot would she get another xray since the non displace fractured showed up on the mri.  Also she is stating that on the left foot that she is still having some pain/discomfort/aching.! She was wondering if its some nerve pain can she be referred to a specialist.  Patient is requesting to see Dr. Marylene Land. Is this ok since she saw Dr. Lilian Kapur

## 2021-06-04 NOTE — Telephone Encounter (Signed)
Patient came in today to picked up cam boot today. Didn't know if charges were placed.

## 2021-06-06 DIAGNOSIS — F330 Major depressive disorder, recurrent, mild: Secondary | ICD-10-CM | POA: Diagnosis not present

## 2021-06-13 DIAGNOSIS — F330 Major depressive disorder, recurrent, mild: Secondary | ICD-10-CM | POA: Diagnosis not present

## 2021-06-18 ENCOUNTER — Encounter: Payer: Self-pay | Admitting: Podiatry

## 2021-06-18 ENCOUNTER — Other Ambulatory Visit: Payer: Self-pay

## 2021-06-18 ENCOUNTER — Ambulatory Visit (INDEPENDENT_AMBULATORY_CARE_PROVIDER_SITE_OTHER): Payer: BC Managed Care – PPO | Admitting: Podiatry

## 2021-06-18 DIAGNOSIS — S92812A Other fracture of left foot, initial encounter for closed fracture: Secondary | ICD-10-CM

## 2021-06-18 DIAGNOSIS — S90122D Contusion of left lesser toe(s) without damage to nail, subsequent encounter: Secondary | ICD-10-CM

## 2021-06-18 DIAGNOSIS — R7989 Other specified abnormal findings of blood chemistry: Secondary | ICD-10-CM | POA: Insufficient documentation

## 2021-06-18 DIAGNOSIS — F3281 Premenstrual dysphoric disorder: Secondary | ICD-10-CM | POA: Insufficient documentation

## 2021-06-18 DIAGNOSIS — F3342 Major depressive disorder, recurrent, in full remission: Secondary | ICD-10-CM | POA: Insufficient documentation

## 2021-06-18 DIAGNOSIS — E786 Lipoprotein deficiency: Secondary | ICD-10-CM | POA: Insufficient documentation

## 2021-06-18 NOTE — Progress Notes (Signed)
°  Subjective:  Patient ID: Beth Reid, female    DOB: 1976-04-09,  MRN: [id number]  Chief Complaint  Patient presents with   Plantar Warts    4 week follow up left foot    46 y.o. female presents with the above complaint. History confirmed with patient.   Doing much better after wearing the cam boot.  She is almost pain-free.  She also switch shoes to [place] and this is helped get rid of the pain around the fifth toe.  Objective:  Physical Exam: warm, good capillary refill, no trophic changes or ulcerative lesions, normal DP and PT pulses, and normal sensory exam. Left Foot: No pain today in the fifth toe or fourth interspace, she has minimal discomfort around the first MTPJ  Radiographs: Multiple views x-ray of the left foot: no fracture, dislocation, swelling or degenerative changes noted  MRI 05/10/2021 showed mild marrow edema in the medial hallux sesamoid with subtle abnormality concerning for nondisplaced fracture Assessment:   1. Contusion of lesser toe of left foot without damage to nail, subsequent encounter   2. Closed fracture of sesamoid bone of left foot, initial encounter      Plan:  Patient was evaluated and treated and all questions answered.  I reviewed the results of the MRI with her.  I discussed that although this finding did not correspond to her previous symptoms it is possible this subtle fracture could lead to her overloading the lateral column and causing the fifth toe pain.  Reverdin that she has improved with CAM boot immobilization and is having minimal pain now.  I recommend she gradually increase her activity level and that she may still want to wear the cam boot for longer distances if she still has any pain.  Advised it may take 1 to 2 months to resolve completely.  Would avoid impact activity.  Follow-up as needed for this or other issues   Return if symptoms worsen or fail to improve.

## 2021-06-20 DIAGNOSIS — F330 Major depressive disorder, recurrent, mild: Secondary | ICD-10-CM | POA: Diagnosis not present

## 2021-07-04 DIAGNOSIS — F330 Major depressive disorder, recurrent, mild: Secondary | ICD-10-CM | POA: Diagnosis not present

## 2021-07-18 DIAGNOSIS — E039 Hypothyroidism, unspecified: Secondary | ICD-10-CM | POA: Diagnosis not present

## 2021-07-18 DIAGNOSIS — F330 Major depressive disorder, recurrent, mild: Secondary | ICD-10-CM | POA: Diagnosis not present

## 2021-07-25 DIAGNOSIS — F330 Major depressive disorder, recurrent, mild: Secondary | ICD-10-CM | POA: Diagnosis not present

## 2021-08-08 DIAGNOSIS — F330 Major depressive disorder, recurrent, mild: Secondary | ICD-10-CM | POA: Diagnosis not present

## 2021-08-12 DIAGNOSIS — J029 Acute pharyngitis, unspecified: Secondary | ICD-10-CM | POA: Diagnosis not present

## 2021-08-14 DIAGNOSIS — F330 Major depressive disorder, recurrent, mild: Secondary | ICD-10-CM | POA: Diagnosis not present

## 2021-08-22 DIAGNOSIS — F330 Major depressive disorder, recurrent, mild: Secondary | ICD-10-CM | POA: Diagnosis not present

## 2021-08-29 DIAGNOSIS — F330 Major depressive disorder, recurrent, mild: Secondary | ICD-10-CM | POA: Diagnosis not present

## 2021-09-03 DIAGNOSIS — F330 Major depressive disorder, recurrent, mild: Secondary | ICD-10-CM | POA: Diagnosis not present

## 2021-09-19 DIAGNOSIS — F330 Major depressive disorder, recurrent, mild: Secondary | ICD-10-CM | POA: Diagnosis not present

## 2021-09-26 DIAGNOSIS — F330 Major depressive disorder, recurrent, mild: Secondary | ICD-10-CM | POA: Diagnosis not present

## 2021-10-01 DIAGNOSIS — E039 Hypothyroidism, unspecified: Secondary | ICD-10-CM | POA: Diagnosis not present

## 2021-10-08 DIAGNOSIS — E039 Hypothyroidism, unspecified: Secondary | ICD-10-CM | POA: Diagnosis not present

## 2021-10-24 DIAGNOSIS — F330 Major depressive disorder, recurrent, mild: Secondary | ICD-10-CM | POA: Diagnosis not present

## 2021-11-07 DIAGNOSIS — F330 Major depressive disorder, recurrent, mild: Secondary | ICD-10-CM | POA: Diagnosis not present

## 2021-11-14 DIAGNOSIS — F330 Major depressive disorder, recurrent, mild: Secondary | ICD-10-CM | POA: Diagnosis not present

## 2021-11-21 DIAGNOSIS — F330 Major depressive disorder, recurrent, mild: Secondary | ICD-10-CM | POA: Diagnosis not present

## 2021-11-28 DIAGNOSIS — F330 Major depressive disorder, recurrent, mild: Secondary | ICD-10-CM | POA: Diagnosis not present

## 2021-12-05 DIAGNOSIS — F330 Major depressive disorder, recurrent, mild: Secondary | ICD-10-CM | POA: Diagnosis not present

## 2021-12-12 DIAGNOSIS — F330 Major depressive disorder, recurrent, mild: Secondary | ICD-10-CM | POA: Diagnosis not present

## 2021-12-26 DIAGNOSIS — F330 Major depressive disorder, recurrent, mild: Secondary | ICD-10-CM | POA: Diagnosis not present

## 2022-01-02 DIAGNOSIS — F330 Major depressive disorder, recurrent, mild: Secondary | ICD-10-CM | POA: Diagnosis not present

## 2022-01-09 DIAGNOSIS — F330 Major depressive disorder, recurrent, mild: Secondary | ICD-10-CM | POA: Diagnosis not present

## 2022-01-09 DIAGNOSIS — E039 Hypothyroidism, unspecified: Secondary | ICD-10-CM | POA: Diagnosis not present

## 2022-01-16 DIAGNOSIS — F330 Major depressive disorder, recurrent, mild: Secondary | ICD-10-CM | POA: Diagnosis not present

## 2022-02-11 ENCOUNTER — Telehealth: Payer: Self-pay | Admitting: Psychiatry

## 2022-02-11 ENCOUNTER — Other Ambulatory Visit: Payer: Self-pay

## 2022-02-11 DIAGNOSIS — F410 Panic disorder [episodic paroxysmal anxiety] without agoraphobia: Secondary | ICD-10-CM

## 2022-02-11 DIAGNOSIS — F411 Generalized anxiety disorder: Secondary | ICD-10-CM

## 2022-02-11 DIAGNOSIS — F3281 Premenstrual dysphoric disorder: Secondary | ICD-10-CM

## 2022-02-11 MED ORDER — BUSPIRONE HCL 30 MG PO TABS: ORAL_TABLET | ORAL | 0 refills | Status: DC

## 2022-02-11 MED ORDER — PAROXETINE HCL 20 MG PO TABS: 20.0000 mg | ORAL_TABLET | Freq: Every day | ORAL | 0 refills | Status: DC

## 2022-02-11 NOTE — Telephone Encounter (Signed)
Rx sent 

## 2022-02-11 NOTE — Telephone Encounter (Signed)
Pt made an appt for 11/2. Wshe needs refills for her paxil and buspar. Pharmacy is [facility] [address] in [place]

## 2022-02-20 ENCOUNTER — Ambulatory Visit (INDEPENDENT_AMBULATORY_CARE_PROVIDER_SITE_OTHER): Payer: BC Managed Care – PPO | Admitting: Psychiatry

## 2022-02-20 ENCOUNTER — Encounter: Payer: Self-pay | Admitting: Psychiatry

## 2022-02-20 DIAGNOSIS — F411 Generalized anxiety disorder: Secondary | ICD-10-CM

## 2022-02-20 DIAGNOSIS — F3281 Premenstrual dysphoric disorder: Secondary | ICD-10-CM | POA: Diagnosis not present

## 2022-02-20 DIAGNOSIS — F410 Panic disorder [episodic paroxysmal anxiety] without agoraphobia: Secondary | ICD-10-CM

## 2022-02-20 MED ORDER — PAROXETINE HCL 20 MG PO TABS: 20.0000 mg | ORAL_TABLET | Freq: Every day | ORAL | 2 refills | Status: DC

## 2022-02-20 MED ORDER — LORAZEPAM 1 MG PO TABS: 1.0000 mg | ORAL_TABLET | Freq: Four times a day (QID) | ORAL | 0 refills | Status: DC | PRN

## 2022-02-20 MED ORDER — BUSPIRONE HCL 30 MG PO TABS: ORAL_TABLET | ORAL | 2 refills | Status: DC

## 2022-02-20 NOTE — Progress Notes (Signed)
Beth Reid [id number] 1975/05/14 47 y.o.  Subjective:   Patient ID:  Beth Reid is a 46 y.o. (DOB 1975-05-15) female.  Chief Complaint:  Chief Complaint  Patient presents with   Follow-up    Anxiety, depression, and PMDD    HPI Beth Reid presents to the office today for follow-up of anxiety, depression, and PMDD. She reports, "I'm doing pretty good."   She saw an endocrinologist and "it has been life altering." She describes now being able to recognize an escalation in her anxiety and using coping strategies "and before I would just get upset."  She reports that libido is not as high as it normally would have been prior to Paxil.  Reports PMDD s/s are now 2-3 days duration instead of a week. She reports that improvement in PMDD s/s have helped anxiety overall. She reports that she tries to take the least amount of Lorazepam necessary. She notices her mood is almost "euphoric" 1-2 days before ovulation. She reports that she experiences occasional sadness and "now I am able to pull back out." She reports that she has been able to lose 15 lbs since starting thyroid medication. Appetite is more in the morning and diminished as the day progresses. Energy and motivation have improved. Sleeping well. Concentration difficulties around menstrual cycles. Concentration is adequate after menses. Denies SI.   Reports they had to pull daughter out of school due to daughter's anxiety and she is now essentially home schooling. She reports that son was just promoted a grade level after academic testing just revealed his IQ is in the 99th percentile.   Has continued to see therapist regularly.   Lorazepam last filled 05/07/21.   Past Psychiatric Medication Trials: Paxil- Severe sexual side effects at 30 mg.  Zoloft Lexapro BuSpar Xanax Ativan Risperidone   PHQ2-9    Flowsheet Row Office Visit from 06/15/2017 in [place] Office Visit from  06/04/2017 in [place] Office Visit from 05/06/2017 in [place] Office Visit from 09/25/2016 in [place]  PHQ-2 Total Score 0 0 1 0  PHQ-9 Total Score -- -- 1 0        Review of Systems:  Review of Systems  Musculoskeletal:  Negative for gait problem.  Neurological:  Negative for tremors.  Psychiatric/Behavioral:         Please refer to HPI    Medications: I have reviewed the patient's current medications.  Current Outpatient Medications  Medication Sig Dispense Refill   levothyroxine (SYNTHROID) 25 MCG tablet Take 25 mcg by mouth daily before breakfast.     busPIRone (BUSPAR) 30 MG tablet TAKE 1 TABLET(30 MG) BY MOUTH TWICE DAILY 180 tablet 2   LORazepam (ATIVAN) 1 MG tablet Take 1 tablet (1 mg total) by mouth every 6 (six) hours as needed for anxiety. 30 tablet 0   PARoxetine (PAXIL) 20 MG tablet Take 1 tablet (20 mg total) by mouth daily. 90 tablet 2   No current facility-administered medications for this visit.    Medication Side Effects: Other: Heat intolerance, weight gain  Allergies:  Allergies  Allergen Reactions   Zoloft  [Sertraline Hcl] Itching    Burning of the skin   Estrogens Anxiety    Panic Attacks   Alprazolam Other (See Comments)   Epinephrine    Lexapro  [Escitalopram Oxalate] Nausea Only    Past Medical History:  Diagnosis Date   Allergy    Anxiety  Depression    Medical history non-contributory     Past Medical History, Surgical history, Social history, and Family history were reviewed and updated as appropriate.   Please see review of systems for further details on the patient's review from today.   Objective:   Physical Exam:  There were no vitals taken for this visit.  Physical Exam Constitutional:      General: She is not in acute distress. Musculoskeletal:        General: No deformity.  Neurological:      Mental Status: She is alert and oriented to person, place, and time.     Coordination: Coordination normal.  Psychiatric:        Attention and Perception: Attention and perception normal. She does not perceive auditory or visual hallucinations.        Mood and Affect: Mood is not depressed. Affect is not labile, blunt, angry or inappropriate.        Speech: Speech normal.        Behavior: Behavior normal. Behavior is cooperative.        Thought Content: Thought content normal. Thought content is not paranoid or delusional. Thought content does not include homicidal or suicidal ideation. Thought content does not include homicidal or suicidal plan.        Cognition and Memory: Cognition and memory normal.        Judgment: Judgment normal.     Comments: Insight intact Mood is less anxious     Lab Review:     Component Value Date/Time   NA 141 05/06/2017 1040   K 4.9 05/06/2017 1040   CL 105 05/06/2017 1040   CO2 29 05/06/2017 1040   GLUCOSE 106 (H) 05/06/2017 1040   BUN 8 05/06/2017 1040   CREATININE 0.79 05/06/2017 1040   CALCIUM 9.9 05/06/2017 1040   PROT 7.4 05/06/2017 1040   ALBUMIN 4.5 05/06/2017 1040   AST 17 05/06/2017 1040   ALT 14 05/06/2017 1040   ALKPHOS 77 05/06/2017 1040   BILITOT 0.4 05/06/2017 1040       Component Value Date/Time   WBC 9.0 05/06/2017 1040   RBC 4.78 05/06/2017 1040   HGB 14.0 05/06/2017 1040   HCT 42.9 05/06/2017 1040   PLT 289.0 05/06/2017 1040   MCV 89.7 05/06/2017 1040   MCH 28.2 12/19/2012 0700   MCHC 32.8 05/06/2017 1040   RDW 13.7 05/06/2017 1040   LYMPHSABS 1.9 05/06/2017 1040   MONOABS 0.6 05/06/2017 1040   EOSABS 0.1 05/06/2017 1040   BASOSABS 0.0 05/06/2017 1040    No results found for: "POCLITH", "LITHIUM"   No results found for: "PHENYTOIN", "PHENOBARB", "VALPROATE", "CBMZ"   .res Assessment: Plan:    Pt seen for 30 minutes and time spent discussing treatment plan and pt reports that she would like to continue current  medications without changes. She reports that she would like to consider dose reduction in the future once family needs are more acute.  Continue Buspar 30 mg po BID for anxiety.  Continue Paxil 20 mg po qd for anxiety and depression.  Continue Ativan 1 mg po q 6 hours prn anxiety.  Pt to follow-up in 9 months or sooner if clinically indicated.  Patient advised to contact office with any questions, adverse effects, or acute worsening in signs and symptoms.   Beth Reid was seen today for follow-up.  Diagnoses and all orders for this visit:  Generalized anxiety disorder -     busPIRone (BUSPAR) 30 MG tablet;  TAKE 1 TABLET(30 MG) BY MOUTH TWICE DAILY -     LORazepam (ATIVAN) 1 MG tablet; Take 1 tablet (1 mg total) by mouth every 6 (six) hours as needed for anxiety.  Premenstrual dysphoric disorder -     LORazepam (ATIVAN) 1 MG tablet; Take 1 tablet (1 mg total) by mouth every 6 (six) hours as needed for anxiety. -     PARoxetine (PAXIL) 20 MG tablet; Take 1 tablet (20 mg total) by mouth daily.  Panic disorder -     LORazepam (ATIVAN) 1 MG tablet; Take 1 tablet (1 mg total) by mouth every 6 (six) hours as needed for anxiety. -     PARoxetine (PAXIL) 20 MG tablet; Take 1 tablet (20 mg total) by mouth daily.     Please see After Visit Summary for patient specific instructions.  Future Appointments  Date Time Provider [place]  11/21/2022  1:45 PM Thayer Headings, PMHNP CP-CP None     No orders of the defined types were placed in this encounter.   -------------------------------

## 2022-03-04 ENCOUNTER — Telehealth: Payer: Self-pay | Admitting: Psychiatry

## 2022-03-04 NOTE — Telephone Encounter (Signed)
Pt informed

## 2022-03-04 NOTE — Telephone Encounter (Signed)
Next visit is 12/13/22. Beth Reid wants to know if Beth Reid can prescribe a weight loss drug called Zepbound for her? Told her I would send a message to see? Her phone number is [phone].

## 2022-03-04 NOTE — Telephone Encounter (Signed)
Please advise.I am sure you can't due to it being a weight loss med

## 2022-11-21 ENCOUNTER — Ambulatory Visit: Payer: BC Managed Care – PPO | Admitting: Psychiatry

## 2023-02-11 ENCOUNTER — Telehealth: Payer: Self-pay | Admitting: Psychiatry

## 2023-02-11 DIAGNOSIS — F3281 Premenstrual dysphoric disorder: Secondary | ICD-10-CM

## 2023-02-11 DIAGNOSIS — F410 Panic disorder [episodic paroxysmal anxiety] without agoraphobia: Secondary | ICD-10-CM

## 2023-02-11 MED ORDER — PAROXETINE HCL 20 MG PO TABS: 20.0000 mg | ORAL_TABLET | Freq: Every day | ORAL | 0 refills | Status: DC

## 2023-02-11 NOTE — Telephone Encounter (Signed)
Patient called in for refill on Paxil 20mg . States she has seven pills left. Ph: [phone] Appt 11/5 Pharmacy [facility] [id number] [place]

## 2023-02-11 NOTE — Telephone Encounter (Signed)
sent 

## 2023-02-24 ENCOUNTER — Encounter: Payer: Self-pay | Admitting: Psychiatry

## 2023-02-24 ENCOUNTER — Ambulatory Visit (INDEPENDENT_AMBULATORY_CARE_PROVIDER_SITE_OTHER): Payer: Self-pay | Admitting: Psychiatry

## 2023-02-24 DIAGNOSIS — F411 Generalized anxiety disorder: Secondary | ICD-10-CM

## 2023-02-24 DIAGNOSIS — F3281 Premenstrual dysphoric disorder: Secondary | ICD-10-CM

## 2023-02-24 DIAGNOSIS — F410 Panic disorder [episodic paroxysmal anxiety] without agoraphobia: Secondary | ICD-10-CM

## 2023-02-24 MED ORDER — PAROXETINE HCL 20 MG PO TABS: 20.0000 mg | ORAL_TABLET | Freq: Every day | ORAL | 3 refills | Status: AC

## 2023-02-24 MED ORDER — BUSPIRONE HCL 30 MG PO TABS: ORAL_TABLET | ORAL | 3 refills | Status: AC

## 2023-02-24 MED ORDER — LORAZEPAM 1 MG PO TABS: 1.0000 mg | ORAL_TABLET | Freq: Four times a day (QID) | ORAL | 0 refills | Status: AC | PRN

## 2023-02-24 NOTE — Progress Notes (Unsigned)
Beth Reid [id number] 24-Dec-1975 47 y.o.  Subjective:   Patient ID:  Beth Reid is a 47 y.o. (DOB 1976/04/21) female.  Chief Complaint:  Chief Complaint  Patient presents with   Follow-up    Depression, anxiety, and PMDD    HPI [name] presents to the office today for follow-up of anxiety, depression, and PMDD. She reports that she has been "doing well... for the first time in my life, I am happy to be alive." She reports that anger and irritability improved with starting treatment for thyroid disorder. She reports that taking Buspar has also been helpful for her anxiety and "feel more positive." She reports that PMDD have been less severe and shorter in duration. She reports, "there are a couple of days I am foggy and irritable." She reports that her mood is slightly elevated right before ovulation to "the worst day" on the day of ovulation. She reports that she has had some panic s/s with increasing thyroid medications. She reports panic attacks are very rare, every 3-6 months. She reports that she is only needing Lorazepam prn on rare occasions. She reports "not as much" depression. She reports situational sadness without persistent depression. Sleep varies. She reports, "I wish I had more motivation." Energy has been good. She questions if she may have ADHD. She reports some long-standing difficulty with concentration and recalls talking "incessantly" as a child. She reports that she would like to lose weight. Denies SI.   She reports that she tried moving Paxil to night time and "I couldn't function" and had trouble with sleep. She reports that she notices that she wants to sleep and eat after taking Paxil in the morning. She reports that she has to take her medications with food.   She reports that she experiences food cravings for sweets.   Home schooling daughter (31 yo). Son (63 yo) is in school.   She continues to see therapist for individual therapy.   Mother has Stage  IV breast cancer.   Past Psychiatric Medication Trials: Paxil- Severe sexual side effects at 30 mg.  Zoloft Lexapro BuSpar Xanax Ativan Risperidone   PHQ2-9    Flowsheet Row Office Visit from 06/15/2017 in [facility] Visit from 06/04/2017 in [facility] Visit from 05/06/2017 in [facility] Visit from 09/25/2016 in [facility] Total Score 0 0 1 0  PHQ-9 Total Score -- -- 1 0        Review of Systems:  Review of Systems  Endocrine: Positive for heat intolerance.       Has been seeing endocrinologist for thyroid disorder  Genitourinary:        Reports that she has not had a period in 42 days.   Musculoskeletal:  Negative for gait problem.  Psychiatric/Behavioral:         Please refer to HPI    Medications: I have reviewed the patient's current medications.  Current Outpatient Medications  Medication Sig Dispense Refill   levothyroxine (SYNTHROID) 25 MCG tablet Take 25 mcg by mouth daily before breakfast. She takes two tablets two days a week     busPIRone (BUSPAR) 30 MG tablet TAKE 1 TABLET(30 MG) BY MOUTH TWICE DAILY 180 tablet 3   LORazepam (ATIVAN) 1 MG tablet Take 1 tablet (1 mg total) by mouth every 6 (six) hours as needed for anxiety. 30 tablet 0  PARoxetine (PAXIL) 20 MG tablet Take 1 tablet (20 mg total) by mouth daily. 90 tablet 3   No current facility-administered medications for this visit.    Medication Side Effects: Other: Sweating, increased appetite  Allergies:  Allergies  Allergen Reactions   Zoloft  [Sertraline Hcl] Itching    Burning of the skin   Estrogens Anxiety    Panic Attacks   Alprazolam Other (See Comments)   Epinephrine    Lexapro  [Escitalopram Oxalate] Nausea Only    Past Medical History:  Diagnosis Date   Allergy    Anxiety    Depression     Medical history non-contributory     Past Medical History, Surgical history, Social history, and Family history were reviewed and updated as appropriate.   Please see review of systems for further details on the patient's review from today.   Objective:   Physical Exam:  Wt 205 lb (93 kg)   BMI 32.11 kg/m   Physical Exam Constitutional:      General: She is not in acute distress. Musculoskeletal:        General: No deformity.  Neurological:     Mental Status: She is alert and oriented to person, place, and time.     Coordination: Coordination normal.  Psychiatric:        Attention and Perception: Attention and perception normal. She does not perceive auditory or visual hallucinations.        Mood and Affect: Mood normal. Mood is not anxious or depressed. Affect is not labile, blunt, angry or inappropriate.        Speech: Speech normal.        Behavior: Behavior normal.        Thought Content: Thought content normal. Thought content is not paranoid or delusional. Thought content does not include homicidal or suicidal ideation. Thought content does not include homicidal or suicidal plan.        Cognition and Memory: Cognition and memory normal.        Judgment: Judgment normal.     Comments: Insight intact     Lab Review:     Component Value Date/Time   NA 141 05/06/2017 1040   K 4.9 05/06/2017 1040   CL 105 05/06/2017 1040   CO2 29 05/06/2017 1040   GLUCOSE 106 (H) 05/06/2017 1040   BUN 8 05/06/2017 1040   CREATININE 0.79 05/06/2017 1040   CALCIUM 9.9 05/06/2017 1040   PROT 7.4 05/06/2017 1040   ALBUMIN 4.5 05/06/2017 1040   AST 17 05/06/2017 1040   ALT 14 05/06/2017 1040   ALKPHOS 77 05/06/2017 1040   BILITOT 0.4 05/06/2017 1040       Component Value Date/Time   WBC 9.0 05/06/2017 1040   RBC 4.78 05/06/2017 1040   HGB 14.0 05/06/2017 1040   HCT 42.9 05/06/2017 1040   PLT 289.0 05/06/2017 1040   MCV 89.7 05/06/2017 1040   MCH 28.2 12/19/2012 0700   MCHC  32.8 05/06/2017 1040   RDW 13.7 05/06/2017 1040   LYMPHSABS 1.9 05/06/2017 1040   MONOABS 0.6 05/06/2017 1040   EOSABS 0.1 05/06/2017 1040   BASOSABS 0.0 05/06/2017 1040    No results found for: "POCLITH", "LITHIUM"   No results found for: "PHENYTOIN", "PHENOBARB", "VALPROATE", "CBMZ"   .res Assessment: Plan:    31 minutes spent dedicated to the care of this patient on the date of this encounter to include pre-visit review of records, ordering of medication, post visit documentation, and face-to-face time  with the patient discussing response to medications and questions regarding side effects. Pt reports that her anxiety and depressive symptoms have significantly improved with current medications, however she reports that she is frustrated with side effect of weight gain. She reports that overall benefits of medications continue to outweigh side effects and she would like to continue current medications without changes.  Continue Paxil 20 mg daily for PMDD and anxiety.  Continue Lorazepam 1 mg every 6 hours as needed for anxiety.  Continue Buspar 30 mg po BID for anxiety.  Recommend continuing psychotherapy.  Pt to follow-up in one year or sooner if clinically indicated. Patient advised to contact office with any questions, adverse effects, or acute worsening in signs and symptoms.   Vida was seen today for follow-up.  Diagnoses and all orders for this visit:  Generalized anxiety disorder -     LORazepam (ATIVAN) 1 MG tablet; Take 1 tablet (1 mg total) by mouth every 6 (six) hours as needed for anxiety. -     busPIRone (BUSPAR) 30 MG tablet; TAKE 1 TABLET(30 MG) BY MOUTH TWICE DAILY  Premenstrual dysphoric disorder -     LORazepam (ATIVAN) 1 MG tablet; Take 1 tablet (1 mg total) by mouth every 6 (six) hours as needed for anxiety. -     PARoxetine (PAXIL) 20 MG tablet; Take 1 tablet (20 mg total) by mouth daily.  Panic disorder -     LORazepam (ATIVAN) 1 MG tablet; Take 1  tablet (1 mg total) by mouth every 6 (six) hours as needed for anxiety. -     PARoxetine (PAXIL) 20 MG tablet; Take 1 tablet (20 mg total) by mouth daily.     Please see After Visit Summary for patient specific instructions.  No future appointments.  No orders of the defined types were placed in this encounter.   -------------------------------

## 2023-03-04 ENCOUNTER — Encounter: Payer: Self-pay | Admitting: Psychiatry
